# Patient Record
Sex: Female | Born: 1980 | Race: White | Hispanic: No | Marital: Single | Smoking: Never smoker
Health system: Southern US, Community
[De-identification: ages and names within clinical notes are randomized; demographics above are authoritative.]

## PROBLEM LIST (undated history)

## (undated) DIAGNOSIS — H4720 Unspecified optic atrophy: Secondary | ICD-10-CM

## (undated) HISTORY — DX: Unspecified optic atrophy: H47.20

---
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
## 2005-06-21 ENCOUNTER — Other Ambulatory Visit: Admission: RE | Admit: 2005-06-21 | Discharge: 2005-06-21 | Payer: Self-pay | Admitting: Family Medicine

## 2013-10-02 ENCOUNTER — Ambulatory Visit: Payer: BC Managed Care – PPO | Attending: Otolaryngology

## 2013-10-02 DIAGNOSIS — R490 Dysphonia: Secondary | ICD-10-CM | POA: Insufficient documentation

## 2013-10-07 ENCOUNTER — Ambulatory Visit: Payer: BC Managed Care – PPO

## 2013-10-15 ENCOUNTER — Ambulatory Visit: Payer: BC Managed Care – PPO | Attending: Otolaryngology

## 2013-10-15 DIAGNOSIS — R490 Dysphonia: Secondary | ICD-10-CM | POA: Insufficient documentation

## 2013-10-17 ENCOUNTER — Ambulatory Visit: Payer: BC Managed Care – PPO

## 2013-10-21 ENCOUNTER — Ambulatory Visit: Payer: BC Managed Care – PPO

## 2013-10-24 ENCOUNTER — Ambulatory Visit: Payer: BC Managed Care – PPO

## 2013-11-12 ENCOUNTER — Ambulatory Visit: Payer: BC Managed Care – PPO | Attending: Otolaryngology

## 2013-11-12 DIAGNOSIS — IMO0001 Reserved for inherently not codable concepts without codable children: Secondary | ICD-10-CM | POA: Diagnosis present

## 2013-11-12 DIAGNOSIS — R490 Dysphonia: Secondary | ICD-10-CM | POA: Insufficient documentation

## 2013-11-27 ENCOUNTER — Ambulatory Visit: Payer: BC Managed Care – PPO

## 2019-07-03 ENCOUNTER — Other Ambulatory Visit: Payer: Self-pay | Admitting: Cardiology

## 2019-07-03 DIAGNOSIS — Z20822 Contact with and (suspected) exposure to covid-19: Secondary | ICD-10-CM

## 2019-07-07 LAB — NOVEL CORONAVIRUS, NAA: SARS-CoV-2, NAA: NOT DETECTED

## 2019-10-11 ENCOUNTER — Ambulatory Visit: Payer: BC Managed Care – PPO | Attending: Internal Medicine

## 2019-10-11 DIAGNOSIS — Z23 Encounter for immunization: Secondary | ICD-10-CM | POA: Insufficient documentation

## 2019-10-11 NOTE — Progress Notes (Signed)
   Covid-19 Vaccination Clinic  Name:  Kelly Harris    MRN: [id number] DOB: 1980-10-08  10/11/2019  Kelly Harris was observed post Covid-19 immunization for 15 minutes without incidence. She was provided with Vaccine Information Sheet and instruction to access the V-Safe system.   Kelly Harris was instructed to call 911 with any severe reactions post vaccine: Marland Kitchen Difficulty breathing  . Swelling of your face and throat  . A fast heartbeat  . A bad rash all over your body  . Dizziness and weakness    Immunizations Administered    Name Date Dose VIS Date Route   Pfizer COVID-19 Vaccine 10/11/2019  4:39 PM 0.3 mL 07/25/2019 Intramuscular   Manufacturer: [employer], [employer]   Lot: [id number]   NDC: [address]-6

## 2019-11-01 ENCOUNTER — Ambulatory Visit: Payer: BC Managed Care – PPO | Attending: Internal Medicine

## 2019-11-01 DIAGNOSIS — Z23 Encounter for immunization: Secondary | ICD-10-CM

## 2019-11-01 NOTE — Progress Notes (Signed)
   Covid-19 Vaccination Clinic  Name:  Diva Lemberger    MRN: [id number] DOB: Jun 03, 1981  11/01/2019  Ms. Turnbo was observed post Covid-19 immunization for 15 minutes without incident. She was provided with Vaccine Information Sheet and instruction to access the V-Safe system.   Ms. Schou was instructed to call 911 with any severe reactions post vaccine: Marland Kitchen Difficulty breathing  . Swelling of face and throat  . A fast heartbeat  . A bad rash all over body  . Dizziness and weakness   Immunizations Administered    Name Date Dose VIS Date Route   Pfizer COVID-19 Vaccine 11/01/2019  8:46 AM 0.3 mL 07/25/2019 Intramuscular   Manufacturer: [employer], [employer]   Lot: [id number]   NDC: [address]-8

## 2020-11-10 ENCOUNTER — Emergency Department (HOSPITAL_BASED_OUTPATIENT_CLINIC_OR_DEPARTMENT_OTHER)
Admission: EM | Admit: 2020-11-10 | Discharge: 2020-11-10 | Disposition: A | Discharge status: Home / Self Care | Payer: BC Managed Care – PPO | Attending: Emergency Medicine | Admitting: Emergency Medicine

## 2020-11-10 ENCOUNTER — Other Ambulatory Visit: Payer: Self-pay

## 2020-11-10 ENCOUNTER — Encounter (HOSPITAL_BASED_OUTPATIENT_CLINIC_OR_DEPARTMENT_OTHER): Payer: Self-pay | Admitting: Emergency Medicine

## 2020-11-10 DIAGNOSIS — R002 Palpitations: Secondary | ICD-10-CM | POA: Diagnosis present

## 2020-11-10 LAB — COMPREHENSIVE METABOLIC PANEL
ALT: 14 U/L (ref 0–44)
AST: 14 U/L — ABNORMAL LOW (ref 15–41)
Albumin: 4.1 g/dL (ref 3.5–5.0)
Alkaline Phosphatase: 74 U/L (ref 38–126)
Anion gap: 10 (ref 5–15)
BUN: 8 mg/dL (ref 6–20)
CO2: 23 mmol/L (ref 22–32)
Calcium: 8.9 mg/dL (ref 8.9–10.3)
Chloride: 104 mmol/L (ref 98–111)
Creatinine, Ser: 0.76 mg/dL (ref 0.44–1.00)
GFR, Estimated: >60 mL/min (ref >60)
Glucose, Bld: 118 mg/dL — ABNORMAL HIGH (ref 70–99)
Potassium: 3.6 mmol/L (ref 3.5–5.1)
Sodium: 137 mmol/L (ref 135–145)
Total Bilirubin: 0.3 mg/dL (ref 0.3–1.2)
Total Protein: 7.3 g/dL (ref 6.5–8.1)

## 2020-11-10 LAB — CBC WITH DIFFERENTIAL/PLATELET
Abs Immature Granulocytes: 0.03 10*3/uL (ref 0.00–0.07)
Basophils Absolute: 0.1 10*3/uL (ref 0.0–0.1)
Basophils Relative: 1 %
Eosinophils Absolute: 0.1 10*3/uL (ref 0.0–0.5)
Eosinophils Relative: 1 %
HCT: 37.9 % (ref 36.0–46.0)
Hemoglobin: 12.4 g/dL (ref 12.0–15.0)
Immature Granulocytes: 0 %
Lymphocytes Relative: 18 %
Lymphs Abs: 1.4 10*3/uL (ref 0.7–4.0)
MCH: 29 pg (ref 26.0–34.0)
MCHC: 32.7 g/dL (ref 30.0–36.0)
MCV: 88.6 fL (ref 80.0–100.0)
Monocytes Absolute: 0.4 10*3/uL (ref 0.1–1.0)
Monocytes Relative: 5 %
Neutro Abs: 5.9 10*3/uL (ref 1.7–7.7)
Neutrophils Relative %: 75 %
Platelets: 297 10*3/uL (ref 150–400)
RBC: 4.28 MIL/uL (ref 3.87–5.11)
RDW: 13 % (ref 11.5–15.5)
WBC: 7.9 10*3/uL (ref 4.0–10.5)
nRBC: 0 % (ref 0.0–0.2)

## 2020-11-10 LAB — MAGNESIUM: Magnesium: 2 mg/dL (ref 1.7–2.4)

## 2020-11-10 LAB — TROPONIN I (HIGH SENSITIVITY): Troponin I (High Sensitivity): <2 ng/L (ref <18)

## 2020-11-10 LAB — LIPASE, BLOOD: Lipase: 5 U/L — ABNORMAL LOW (ref 11–51)

## 2020-11-10 LAB — D-DIMER, QUANTITATIVE: D-Dimer, Quant: 0.84 ug/mL-FEU — ABNORMAL HIGH (ref 0.00–0.50)

## 2020-11-10 MED ORDER — SODIUM CHLORIDE 0.9 % IV BOLUS
1000.0000 mL | Freq: Once | INTRAVENOUS | Status: AC
  Administered 2020-11-10: 1000 mL via INTRAVENOUS

## 2020-11-10 NOTE — ED Triage Notes (Signed)
Pt stated that 2 days ago she felt her heart was beating in a way that she was aware that it was beating, but not necessarily beating fast. Pt stated that today she felt her heart was racing. Pt also complains of a recent bout of diarrhea.

## 2020-11-10 NOTE — ED Provider Notes (Signed)
[facility] EMERGENCY DEPT Provider Note   CSN: [id number] Arrival date & time: 11/10/20  1435     History Chief Complaint  Patient presents with  . Chest Pain    Kelly Harris is a 40 y.o. female.  Patient presents with sensation that her heart is beating more firmly than it typically does.  She states this is been going off and on for the past 2 weeks.  She had it 2 weeks ago for a few days and then resolved.  She has been doing well until today she noticed her again when she felt her heartbeat was beating stronger than it typically does.  She denies any irregular beat sensation.  Denies any chest pain or chest pressure.  Denies any fever cough vomiting or diarrhea.        History reviewed. No pertinent past medical history.  There are no problems to display for this patient.   History reviewed. No pertinent surgical history.   OB History   No obstetric history on file.     No family history on file.  Social History   Substance Use Topics  . Alcohol use: Not Currently  . Drug use: Not Currently    Home Medications Prior to Admission medications   Not on File    Allergies    Amoxicillin  Review of Systems   Review of Systems  Constitutional: Negative for fever.  HENT: Negative for ear pain.   Eyes: Negative for pain.  Respiratory: Negative for cough.   Cardiovascular: Positive for palpitations. Negative for chest pain.  Gastrointestinal: Negative for abdominal pain.  Genitourinary: Negative for flank pain.  Musculoskeletal: Negative for back pain.  Skin: Negative for rash.  Neurological: Negative for headaches.    Physical Exam Updated Vital Signs BP (!) 154/83   Pulse 82   Temp 98.4 F (36.9 C) (Oral)   Resp 17   Ht 5\' 4"  (1.626 m)   Wt 108.9 kg   LMP 10/19/2020 (Approximate)   SpO2 100%   BMI 41.20 kg/m   Physical Exam Constitutional:      General: She is not in acute distress.    Appearance: Normal appearance.   HENT:     Head: Normocephalic.     Nose: Nose normal.  Eyes:     Extraocular Movements: Extraocular movements intact.  Cardiovascular:     Rate and Rhythm: Normal rate.  Pulmonary:     Effort: Pulmonary effort is normal.  Musculoskeletal:        General: Normal range of motion.     Cervical back: Normal range of motion.  Neurological:     General: No focal deficit present.     Mental Status: She is alert. Mental status is at baseline.     ED Results / Procedures / Treatments   Labs (all labs ordered are listed, but only abnormal results are displayed) Labs Reviewed  COMPREHENSIVE METABOLIC PANEL - Abnormal; Notable for the following components:      Result Value   Glucose, Bld 118 (*)    AST 14 (*)    All other components within normal limits  LIPASE, BLOOD - Abnormal; Notable for the following components:   Lipase 5 (*)    All other components within normal limits  D-DIMER, QUANTITATIVE - Abnormal; Notable for the following components:   D-Dimer, Quant 0.84 (*)    All other components within normal limits  CBC WITH DIFFERENTIAL/PLATELET  MAGNESIUM  TROPONIN I (HIGH SENSITIVITY)    EKG  EKG Interpretation  Date/Time:  Wednesday November 10 2020 14:43:49 EDT Ventricular Rate:  104 PR Interval:  189 QRS Duration: 93 QT Interval:  340 QTC Calculation: 448 R Axis:   102 Text Interpretation: Sinus tachycardia Right axis deviation Low voltage, precordial leads Borderline repolarization abnormality No old tracing to compare Confirmed by Melene Plan [phone]) on 11/10/2020 2:52:09 PM   Radiology No results found.  Procedures Procedures   Medications Ordered in ED Medications  sodium chloride 0.9 % bolus 1,000 mL (0 mLs Intravenous Stopped 11/10/20 1732)    ED Course  I have reviewed the triage vital signs and the nursing notes.  Pertinent labs & imaging results that were available during my care of the patient were reviewed by me and considered in my medical decision  making (see chart for details).    MDM Rules/Calculators/A&P                          Patient initially arrived hypertensive and tachycardic.  However she states that she feels a bit anxious given that she is coming in due to concern of her heart.  That she relax a bit in the ER heart rate and blood pressure appear improved.  Troponin is undetectable D-dimer elevated but in this setting significance is unclear.  I doubt pulmonary embolism given that she is asymptomatic at this time.  She has no leg swelling or peripheral edema, will recommend outpatient follow-up with her doctor within 3 to 4 days.  Recommend immediate return for worsening symptoms fevers pain trouble breathing or any additional concerns.  Final Clinical Impression(s) / ED Diagnoses Final diagnoses:  Palpitations    Rx / DC Orders ED Discharge Orders    None       Cheryll Cockayne, MD 11/10/20 1759

## 2020-11-10 NOTE — Discharge Instructions (Signed)
Call your primary care doctor or specialist as discussed in the next 2-3 days.   Return immediately back to the ER if:  Your symptoms worsen within the next 12-24 hours. You develop new symptoms such as new fevers, persistent vomiting, new pain, shortness of breath, or new weakness or numbness, or if you have any other concerns.  

## 2020-11-11 ENCOUNTER — Other Ambulatory Visit (HOSPITAL_BASED_OUTPATIENT_CLINIC_OR_DEPARTMENT_OTHER)
Admission: RE | Admit: 2020-11-11 | Discharge: 2020-11-11 | Disposition: A | Discharge status: Home / Self Care | Payer: BC Managed Care – PPO | Source: Ambulatory Visit | Attending: Nurse Practitioner | Admitting: Nurse Practitioner

## 2020-11-11 ENCOUNTER — Ambulatory Visit (HOSPITAL_BASED_OUTPATIENT_CLINIC_OR_DEPARTMENT_OTHER): Payer: BC Managed Care – PPO | Admitting: Family Medicine

## 2020-11-11 ENCOUNTER — Encounter (HOSPITAL_BASED_OUTPATIENT_CLINIC_OR_DEPARTMENT_OTHER): Payer: Self-pay | Admitting: Family Medicine

## 2020-11-11 VITALS — BP 156/100 | HR 96 | Ht 64.5 in | Wt 238.6 lb

## 2020-11-11 DIAGNOSIS — R030 Elevated blood-pressure reading, without diagnosis of hypertension: Secondary | ICD-10-CM | POA: Insufficient documentation

## 2020-11-11 DIAGNOSIS — R002 Palpitations: Secondary | ICD-10-CM | POA: Diagnosis present

## 2020-11-11 DIAGNOSIS — Z6841 Body Mass Index (BMI) 40.0 and over, adult: Secondary | ICD-10-CM | POA: Diagnosis present

## 2020-11-11 DIAGNOSIS — Z1283 Encounter for screening for malignant neoplasm of skin: Secondary | ICD-10-CM

## 2020-11-11 DIAGNOSIS — Z0000 Encounter for general adult medical examination without abnormal findings: Secondary | ICD-10-CM

## 2020-11-11 LAB — LIPID PANEL
Cholesterol: 181 mg/dL (ref 0–200)
HDL: 50 mg/dL (ref >40)
LDL Cholesterol: 111 mg/dL — ABNORMAL HIGH (ref 0–99)
Total CHOL/HDL Ratio: 3.6 RATIO
Triglycerides: 98 mg/dL (ref <150)
VLDL: 20 mg/dL (ref 0–40)

## 2020-11-11 LAB — HEMOGLOBIN A1C
Hgb A1c MFr Bld: 5.3 % (ref 4.8–5.6)
Mean Plasma Glucose: 105.41 mg/dL

## 2020-11-11 NOTE — Assessment & Plan Note (Signed)
Discussed lifestyle modifications as above Will monitor weight at follow-up office visits Will refer to nutritionist

## 2020-11-11 NOTE — Patient Instructions (Addendum)
  Medication Instructions:  Your physician recommends that you continue on your current medications as directed. Please refer to the Current Medication list given to you today. --If you need a refill on any your medications before your next appointment, please call your pharmacy first. If no refills are authorized on file call the office.--  Lab Work: Your physician has recommended that you have lab work today: Hemoglobin A1C, Lipid Panel, and Thyroid Panel If you have labs (blood work) drawn today and your tests are completely normal, you will receive your results only by: Marland Kitchen MyChart Message (if you have MyChart) OR . A phone call from our staff. Please ensure you check your voicemail in the event that you authorized detailed messages to be left on a delegated number. If you have any lab test that is abnormal or we need to change your treatment, we will call you to review the results.  Referrals/Procedures/Imaging: A referral has been placed for you to Dr. Hyacinth Meeker for OB/GYBN. Someone from the scheduling department will be in contact with you in regards to coordinating your consultation. If you do not hear from any of the schedulers within 7-10 business days please give our office a call.  A referral has been placed for you to Dermatology for skin cancer screening. Someone from the scheduling department will be in contact with you in regards to coordinating your consultation. If you do not hear from any of the schedulers within 7-10 business days please give our office a call.  A referral has been placed for you to Medical Nutrition Therapy. Someone from the scheduling department will be in contact with you in regards to coordinating your consultation. If you do not hear from any of the schedulers within 7-10 business days please give our office a call.  Follow-Up: Your next appointment:   Your physician recommends that you schedule a follow-up appointment in: 1 MONTH with Dr. De Peru   Thanks  for letting us be apart of your health journey!!  Primary Care and Sports Medicine   Dr. de Peru and Shawna Clamp, DNP, AGNP  We recommend signing up for the patient portal called "MyChart".  Sign up information is provided on this After Visit Summary.  MyChart is used to connect with patients for Virtual Visits (Telemedicine).  Patients are able to view lab/test results, encounter notes, upcoming appointments, etc.  Non-urgent messages can be sent to your provider as well.   To learn more about what you can do with MyChart, go to [email or web address].     ---- CHECL YOUR BLOOD PRESSURE TWICE DAILY UNTIL FOLLOW UP APPOINTMENT AND RECORD ON LOG. BRING YOUR BLOOD PRESSURE CUFF IN WITH YOU TO FOLLOW UP APPOINTMENT------

## 2020-11-11 NOTE — Assessment & Plan Note (Addendum)
Reviewed documentation, labs, EKG from ER visit yesterday Blood pressure elevated at emergency room visit yesterday as well as in the office today She does have blood pressure cuff at home, advised on monitoring at home and keeping blood pressure log to bring to next office visit Advised on lifestyle modifications including low-salt diet, recommended weekly activity level, encouraging healthy weight loss Plan for follow-up in about 4 weeks to monitor

## 2020-11-11 NOTE — Progress Notes (Signed)
New Patient Office Visit  Subjective:  Patient ID: Kelly Harris, female    DOB: [birth year]  Age: 40 y.o. MRN: [id number]  CC:  Chief Complaint  Patient presents with  . Establish Care  . Palpitations  . Diarrhea    HPI Kelly Harris is a 40 year old female presenting to establish in clinic.  She has current concerns related to palpitations, elevated blood pressure.  Denies any significant past medical history  Palpitations: Presented to ED yesterday for evaluation of palpitations.  While at the ED she was found to have elevated blood pressure.  Lab work-up and EKG were largely unremarkable.  She reports an associated diarrhea that developed around the same time as palpitations about 2 weeks ago.  She did have a period of a couple days where she did not have any bowel movements and thus used Colace with subsequent frequent, loose stools again.  She is not aware of being told about high blood pressure in the past.  Has been at least 3 years since having a PCP.  Denies any chest pain or vision changes, no increased shortness of breath.  Palpitations are somewhat lessened today compared to yesterday.  Patient also desiring referrals to OB/GYN for cervical cancer screening and dermatology for skin cancer screening.  History reviewed. No pertinent past medical history.  History reviewed. No pertinent surgical history.  Family History  Problem Relation Age of Onset  . Stroke Mother   . Hypertension Mother   . Diabetes Mother   . Congestive Heart Failure Father   . Heart disease Father   . Hypertension Father   . COPD Paternal Grandmother     Social History   Socioeconomic History  . Marital status: Single    Spouse name: Not on file  . Number of children: Not on file  . Years of education: Not on file  . Highest education level: Not on file  Occupational History  . Not on file  Tobacco Use  . Smoking status: Never Smoker  . Smokeless tobacco: Never Used  Vaping Use   . Vaping Use: Never used  Substance and Sexual Activity  . Alcohol use: Not Currently  . Drug use: Not Currently  . Sexual activity: Not Currently    Birth control/protection: None  Other Topics Concern  . Not on file  Social History Narrative  . Not on file   Social Determinants of Health   Financial Resource Strain: Not on file  Food Insecurity: Not on file  Transportation Needs: Not on file  Physical Activity: Not on file  Stress: Not on file  Social Connections: Not on file  Intimate Partner Violence: Not on file    Objective:   Today's Vitals: BP (!) 156/100   Pulse 96   Ht 5' 4.5" (1.638 m)   Wt 238 lb 9.6 oz (108.2 kg)   LMP 10/19/2020 (Approximate)   SpO2 100%   BMI 40.32 kg/m   Physical Exam  Pleasant 40 year old female in no acute distress Cardiovascular exam with regular rate and rhythm, no murmurs appreciated Lungs clear to auscultation bilaterally  Assessment & Plan:   Problem List Items Addressed This Visit      Other   Elevated blood pressure reading in office without diagnosis of hypertension    Blood pressure elevated at emergency room visit yesterday as well as in the office today She does have blood pressure cuff at home, advised on monitoring at home and keeping blood pressure log to bring to next office  visit Advised on lifestyle modifications including low-salt diet, recommended weekly activity level, encouraging healthy weight loss Plan for follow-up in about 4 weeks to monitor      Relevant Orders   Amb ref to Medical Nutrition Therapy-[facility]   Thyroid Panel With TSH   Hemoglobin A1c   Lipid panel   BMI 40.0-44.9, adult (HCC)    Discussed lifestyle modifications as above Will monitor weight at follow-up office visits Will refer to nutritionist      Relevant Orders   Amb ref to Medical Nutrition Therapy-[facility]   Thyroid Panel With TSH   Hemoglobin A1c   Lipid panel    Other Visit Diagnoses    Encounter for medical examination to  establish care    -  Primary   Relevant Orders   Ambulatory referral to Obstetrics / Gynecology   Screening for skin cancer       Relevant Orders   Ambulatory referral to Dermatology   Palpitation       Relevant Orders   Thyroid Panel With TSH      No outpatient encounter medications on file as of 11/11/2020.   No facility-administered encounter medications on file as of 11/11/2020.   Spent 60 minutes on this patient encounter, including preparation, chart review, face-to-face counseling with patient and coordination of care, and documentation of encounter  Follow-up: Return in about 1 month (around 12/11/2020) for Follow Up.   Starling Christofferson J De Peru, MD

## 2020-11-12 LAB — THYROID PANEL WITH TSH
Free Thyroxine Index: 1.9 (ref 1.2–4.9)
T3 Uptake Ratio: 27 % (ref 24–39)
T4, Total: 6.9 ug/dL (ref 4.5–12.0)
TSH: 1.6 u[IU]/mL (ref 0.450–4.500)

## 2020-11-14 ENCOUNTER — Telehealth (HOSPITAL_BASED_OUTPATIENT_CLINIC_OR_DEPARTMENT_OTHER): Payer: Self-pay

## 2020-11-14 NOTE — Telephone Encounter (Signed)
Results released by Dr. De [place] and reviewed by patient via MyChart Instructed patient to contact the office with any questions or concerns. 

## 2020-11-14 NOTE — Telephone Encounter (Signed)
-----   Message from Hosie Poisson Peru, MD sent at 11/12/2020  8:47 AM EDT ----- Overall labs look good.  Thyroid function normal.  A1c test checking for diabetes was normal.  Cholesterol overall looks good with slight elevation in the "bad" cholesterol.

## 2020-12-13 ENCOUNTER — Other Ambulatory Visit (HOSPITAL_BASED_OUTPATIENT_CLINIC_OR_DEPARTMENT_OTHER): Payer: Self-pay

## 2020-12-13 ENCOUNTER — Ambulatory Visit (HOSPITAL_BASED_OUTPATIENT_CLINIC_OR_DEPARTMENT_OTHER): Payer: BC Managed Care – PPO | Admitting: Family Medicine

## 2020-12-13 ENCOUNTER — Other Ambulatory Visit: Payer: Self-pay

## 2020-12-13 ENCOUNTER — Encounter (HOSPITAL_BASED_OUTPATIENT_CLINIC_OR_DEPARTMENT_OTHER): Payer: Self-pay | Admitting: Family Medicine

## 2020-12-13 VITALS — BP 150/94 | HR 102 | Ht 65.4 in | Wt 225.4 lb

## 2020-12-13 DIAGNOSIS — R030 Elevated blood-pressure reading, without diagnosis of hypertension: Secondary | ICD-10-CM

## 2020-12-13 NOTE — Progress Notes (Signed)
    Procedures performed today:    None.  Independent interpretation of notes and tests performed by another provider:   None.  Brief History, Exam, Impression, and Recommendations:    Isis is a 41 year old female presenting for follow-up of palpitations and elevated blood pressure readings.  Today she brought blood pressure log with her.  This shows that blood pressure has been at goal in recent weeks.  There is a trend of gradually decreasing systolic and diastolic blood pressure since last visit with me.  She indicates that she has been making lifestyle modifications including dietary changes and gradual increase in physical activity.  Reports that she is trying to incorporate more fresh fruits and vegetables and decreasing salt intake.  Reports about an intentional 10 pound weight loss since last visit.  Denies any issues with lightheadedness, dizziness, chest pain, shortness of breath.  She also denies any episodes of palpitations since last visit with me.  Overall feels that she has been doing well.  BP (!) 150/94 (BP Location: Left Arm)   Pulse (!) 102   Ht 5' 5.4" (1.661 m)   Wt 225 lb 6.4 oz (102.2 kg)   SpO2 96%   BMI 37.05 kg/m   Pleasant 39 year old female in no acute distress Cardiovascular exam with regular rate and rhythm, no murmurs appreciated  Elevated blood pressure reading in office without diagnosis of hypertension Blood pressure elevated in the office today, however review of home blood pressure log with normal readings Home BP log scanned into chart Continue with lifestyle modifications, provided encouragement regarding healthy weight loss Has upcoming appointment with nutritionist, encouraged to keep this Plan for follow-up in about 2 months Will have patient bring home blood pressure cuff to compare to our cuff in office    ___________________________________________ Madoc Holquin de Peru, MD, ABFM, CAQSM Primary Care and Sports Medicine [facility]

## 2020-12-13 NOTE — Patient Instructions (Signed)
  Medication Instructions:  Your physician recommends that you continue on your current medications as directed. Please refer to the Current Medication list given to you today. --If you need a refill on any your medications before your next appointment, please call your pharmacy first. If no refills are authorized on file call the office.--  Follow-Up: Your next appointment:   Your physician recommends that you schedule a follow-up appointment in: 2 MONTHS with Dr. de Peru  Thanks for letting us be apart of your health journey!!  Primary Care and Sports Medicine   Dr. de Peru and Shawna Clamp, DNP, AGNP  We recommend signing up for the patient portal called "MyChart".  Sign up information is provided on this After Visit Summary.  MyChart is used to connect with patients for Virtual Visits (Telemedicine).  Patients are able to view lab/test results, encounter notes, upcoming appointments, etc.  Non-urgent messages can be sent to your provider as well.   To learn more about what you can do with MyChart, please visit --  [email or web address].

## 2020-12-14 NOTE — Assessment & Plan Note (Signed)
Blood pressure elevated in the office today, however review of home blood pressure log with normal readings Home BP log scanned into chart Continue with lifestyle modifications, provided encouragement regarding healthy weight loss Has upcoming appointment with nutritionist, encouraged to keep this Plan for follow-up in about 2 months Will have patient bring home blood pressure cuff to compare to our cuff in office

## 2020-12-22 ENCOUNTER — Encounter: Payer: BC Managed Care – PPO | Attending: Family Medicine | Admitting: Dietician

## 2020-12-22 ENCOUNTER — Encounter: Payer: Self-pay | Admitting: Dietician

## 2020-12-22 ENCOUNTER — Other Ambulatory Visit: Payer: Self-pay

## 2020-12-22 DIAGNOSIS — R030 Elevated blood-pressure reading, without diagnosis of hypertension: Secondary | ICD-10-CM | POA: Diagnosis present

## 2020-12-22 DIAGNOSIS — Z6841 Body Mass Index (BMI) 40.0 and over, adult: Secondary | ICD-10-CM | POA: Diagnosis present

## 2020-12-22 NOTE — Progress Notes (Signed)
Medical Nutrition Therapy  Appointment Start time:  1600  Appointment End time:  1700  Primary concerns today: Blood pressure, weight loss  Referral diagnosis: R03.0 Elevated blood pressure reading in office without diagnosis of hypertension, Z68.41 BMI 40.0 -44.9, Adult Preferred learning style: No preference indicated Learning readiness: Change in progress   NUTRITION ASSESSMENT   Anthropometrics  Ht:5'5" Wt: 224.1 lbs Body mass index is 37.29 kg/m.   Clinical Medical Hx: Heart palpitations, Vitamin D deficiency Medications: N/A Labs: LDL -111 (high), Lipase - 5 (low) Notable Signs/Symptoms: N/A  Lifestyle & Dietary Hx Pt reports a bout of GI discomfort and overall discomfort that led to them visiting the doctor and getting a diagnosis of elevated blood pressure. Pt has been monitoring blood pressure at home for the past 5-6 weeks and has been keeping a log. Average BP started at around ~140/90, is now ~120/80.  Pt reports making lifestyle and dietary changes over the last 6 weeks. Pt has been walking daily with their dog for 45 minutes. Also adding in body weight resistance exercises. Pt feels these changes are sustainable. Pt reports losing close to 20 pounds over the last 6 weeks. Pt is using My[employer] to track foods. Pt brought printed food log for the past month. Pt diet consists of a large variety of fruits and vegetables, plant based and lean proteins. Pt has reduced consumption of processed foods to reduce sodium intake. Pt is using salt free seasonings   Estimated daily fluid intake: 64 oz Supplements: N/A Sleep: Sleeps well Stress / self-care: Moderate, not too bad Current average weekly physical activity: Walks with dog daily ~45 minutes  24-Hr Dietary Recall First Meal: Avocado toast, boiled egg, OJ Snack: banana Second Meal: Salad with cheese, [place], balsamic vinegarette, hummus, wheat thins Snack: none Third Meal: Garden veggie pizza, ginger ale Snack:  none Beverages: Water, ginger ale   NUTRITION DIAGNOSIS  NB-1.1 Food and nutrition-related knowledge deficit As related to elevated blood pressure.  As evidenced by blood pressure reading of 156/100, previous sedentary lifestly, and intake high in sodium,.   NUTRITION INTERVENTION  Nutrition education (E-1) on the following topics:  . Educated patient on an appropriate blood pressure goal of <120/80. Educated patient on the relationship between dietary sodium intake and hypertension. Recommended between 1,500 and 2,000 mg of sodium per day. Educated patient on common sources of high sodium foods including packaged/processed foods, deli meats, fast foods, pickled food, sports drinks, and canned foods. Educated patient on the potential for kidney and heart complications related to uncontrolled hypertension. Educated patient on the combined effect of hypertension and elevated cholesterol on cardiovascular health. Educated patient on the positive impact of physical activity in lowering blood pressure and improving cardiovascular health.    Handouts Provided Include   White Bean Bruschetta Recipe  Hypertension Nutrition Therapy Nutrition Care Manual  Learning Style & Readiness for Change Teaching method utilized: Visual & Auditory  Demonstrated degree of understanding via: Teach Back  Barriers to learning/adherence to lifestyle change: None  Goals Established by Pt  Look into Roots brand hummus for a lower sodium hummus option  Try Chicken of the [employer] for a lower sodium salmon option.  Look into Spice [place] seasonings for great low or sodium free seasonings  Take a One-A-Day Prenatal vitamin.  Increase your calcium with 1% milk, fat-free greek yogurt, and calcium fortified orange juice  Look into https://www.dunlap.com/ for heart healthy recipes    MONITORING & EVALUATION Dietary intake, weekly physical activity,  and sodium intake in 1 month.  Next Steps  Patient is to  follow up with Rd.

## 2020-12-22 NOTE — Patient Instructions (Addendum)
Look into Roots brand hummus for a lower sodium hummus option  Try Chicken of the [employer] for a lower sodium salmon option.  Look into Spice [place] seasonings for great low or sodium free seasonings  Take a One-A-Day Prenatal vitamin.  Increase your calcium with 1% milk, fat-free greek yogurt, and calcium fortified orange juice  Look into [email or web address].dunlap.[email or web address] for heart healthy recipes

## 2021-01-27 ENCOUNTER — Ambulatory Visit (HOSPITAL_BASED_OUTPATIENT_CLINIC_OR_DEPARTMENT_OTHER): Payer: BC Managed Care – PPO | Admitting: Obstetrics & Gynecology

## 2021-02-03 ENCOUNTER — Encounter: Payer: BC Managed Care – PPO | Attending: Family Medicine | Admitting: Dietician

## 2021-02-03 ENCOUNTER — Encounter: Payer: Self-pay | Admitting: Dietician

## 2021-02-03 ENCOUNTER — Other Ambulatory Visit: Payer: Self-pay

## 2021-02-03 DIAGNOSIS — R030 Elevated blood-pressure reading, without diagnosis of hypertension: Secondary | ICD-10-CM | POA: Insufficient documentation

## 2021-02-03 NOTE — Progress Notes (Signed)
Medical Nutrition Therapy  Appointment Start time:  1400  Appointment End time:  1430  Primary concerns today: Blood pressure, weight loss  Referral diagnosis: R03.0 Elevated blood pressure reading in office without diagnosis of hypertension, Z68.41 BMI 40.0 -44.9, Adult Preferred learning style: No preference indicated Learning readiness: Change in progress   NUTRITION ASSESSMENT   Anthropometrics  Ht:5'5" Wt: 214.6 lbs Wt Change: - 10 lbs Body mass index is 35.71 kg/m.   Clinical Medical Hx: Heart palpitations, Vitamin D deficiency Medications: N/A Labs: LDL -111 (high), Lipase - 5 (low) Notable Signs/Symptoms: N/A  Lifestyle & Dietary Hx Pt has continued to log foods. Pt food log reveals they are hitting their daily goals of 1,600 kcal and 2,300 mg sodium very often. Pt is eating fat-free greek yogurt and fortified orange juice to increase calcium. Also incorporating ground [place] and smoked salmon for lean proteins as well.  Pt still checking BP at home, brought log in. Average BP in left arm is~120/85, right arm is ~ 135/90 Walking daily, getting around 10,000 steps a day.Pt is resistance training twice a week, 3 days apart.  Pt is using Fit[employer] app for exercises. Does full body workouts. Pt states their motivation initially came from health concerns, but their successful weight loss has become a powerful motivator to continue these changes.     Estimated daily fluid intake: 64 oz Supplements: NEW: Women's MV Sleep: Sleeps well Stress / self-care: Moderate, not too bad Current average weekly physical activity: Walks with dog daily ~45 minutes, 2x days resistance training   24-Hr Dietary Recall First Meal: Egg white frittata, [place] sausage Snack: none Second Meal: Acai bowl w/ granola, spinach, kiwi, raspberries Snack: none Third Meal: Leftover mediterranean food, lamb, cous cous, artichoke Snack: none Beverages: water, OJ    NUTRITION DIAGNOSIS  NB-1.1 Food  and nutrition-related knowledge deficit As related to elevated blood pressure.  As evidenced by blood pressure reading of 156/100, previous sedentary lifestly, and intake high in sodium,.   NUTRITION INTERVENTION  Nutrition education (E-1) on the following topics:  Educated patient on an appropriate blood pressure goal of <120/80. Educated patient on the relationship between dietary sodium intake and hypertension. Recommended between 1,500 and 2,000 mg of sodium per day. Educated patient on common sources of high sodium foods including packaged/processed foods, deli meats, fast foods, pickled food, sports drinks, and canned foods. Educated patient on the potential for kidney and heart complications related to uncontrolled hypertension. Educated patient on the combined effect of hypertension and elevated cholesterol on cardiovascular health. Educated patient on the positive impact of physical activity in lowering blood pressure and improving cardiovascular health.  NEW: Educate pt on the difference between LDL and HDL cholesterol. Educate pt on the factors that can increase and decrease HDL cholesterol. Including exercise to increase HDL, and tobacco use to decrease HDL. Educate pt on factors that can elevate LDL cholesterol, including high dietary intake of saturated fats. Educate pt on identifying sources of saturated fats, and how to make alternative food choices to lower saturated fat intake. Educate pt on the role of soluble fiber in binding to cholesterol in the GI tract an eliminating it from the body. Educate pt on dietary sources of soluble fiber. Educate pt on the potential dietary causes of hypertriglyceridemia.Educate on the role of elevated LDL,total cholesterol, and triglycerides on cardiovascular health. Educate pt on the role of physical activity in lowering LDL and increasing HDL cholesterol.    Handouts Provided Include  White Bean Bruschetta  Recipe Hypertension Nutrition Therapy  Nutrition Care Manual NEW: Soluble Fiber List Nutrition Care Manual  Learning Style & Readiness for Change Teaching method utilized: Visual & Auditory  Demonstrated degree of understanding via: Teach Back  Barriers to learning/adherence to lifestyle change: None  Goals Established by Pt Add in a third day of resistance training. Try not to allow more than 48 hours between exercise sessions to maximize cardiovascular benefits. Add in a calcium chewable in between breakfast and lunch, or between lunch and dinner. Continue to limit saturated fats (high fat meats, dairy, processed and packaged foods) and choose unsaturated fats (plant based oils, seafood, nuts, seeds, avocado). Increase soluble fiber intake to 5-10 grams a day. Use your soluble fiber handout for a variety of options.   MONITORING & EVALUATION Dietary intake, weekly physical activity, sodium and calcium intake in 2 months.  Next Steps  Patient is to follow up with RDN.

## 2021-02-03 NOTE — Patient Instructions (Addendum)
Add in a third day of resistance training. Try not to allow more than 48 hours between exercise sessions to maximize cardiovascular benefits.  Add in a calcium chewable in between breakfast and lunch, or between lunch and dinner.  Continue to limit saturated fats (high fat meats, dairy, processed and packaged foods) and choose unsaturated fats (plant based oils, seafood, nuts, seeds, avocado).  Increase soluble fiber intake to 5-10 grams a day. Use your soluble fiber handout for a variety of options.

## 2021-02-15 ENCOUNTER — Other Ambulatory Visit: Payer: Self-pay

## 2021-02-15 ENCOUNTER — Encounter (HOSPITAL_BASED_OUTPATIENT_CLINIC_OR_DEPARTMENT_OTHER): Payer: Self-pay | Admitting: Family Medicine

## 2021-02-15 ENCOUNTER — Ambulatory Visit (HOSPITAL_BASED_OUTPATIENT_CLINIC_OR_DEPARTMENT_OTHER): Payer: BC Managed Care – PPO | Admitting: Family Medicine

## 2021-02-15 VITALS — BP 146/96 | HR 97 | Ht 64.5 in | Wt 210.0 lb

## 2021-02-15 DIAGNOSIS — E669 Obesity, unspecified: Secondary | ICD-10-CM | POA: Diagnosis not present

## 2021-02-15 DIAGNOSIS — R030 Elevated blood-pressure reading, without diagnosis of hypertension: Secondary | ICD-10-CM

## 2021-02-15 NOTE — Assessment & Plan Note (Signed)
Has been checking blood pressure at home, log brought to office today which has been scanned into the chart Continues with lifestyle modifications including dietary changes as well as increased level of physical activity Has met with dietitian twice since last visit with me, this has been going well No specific concerns today Cardiovascular exam with regular rate and rhythm, no murmurs appreciated Blood pressure slightly elevated in the office today, however given home blood pressure readings, will continue to monitor and allow for continuing with lifestyle modifications Discussed recommendations for aerobic and anaerobic exercise weekly Continue follow-up with dietitian Plan for follow-up with me in about 3 to 4 months to monitor above

## 2021-02-15 NOTE — Patient Instructions (Addendum)
  Medication Instructions:  Your physician recommends that you continue on your current medications as directed. Please refer to the Current Medication list given to you today. --If you need a refill on any your medications before your next appointment, please call your pharmacy first. If no refills are authorized on file call the office.-- Follow-Up: Your next appointment:   Your physician recommends that you schedule a follow-up appointment in: 3-4 MONTHS with Dr. de Cuba  Thanks for letting us be apart of your health journey!!  Primary Care and Sports Medicine   Dr. Raymond de Cuba   We encourage you to activate your patient portal called "MyChart".  Sign up information is provided on this After Visit Summary.  MyChart is used to connect with patients for Virtual Visits (Telemedicine).  Patients are able to view lab/test results, encounter notes, upcoming appointments, etc.  Non-urgent messages can be sent to your provider as well. To learn more about what you can do with MyChart, please visit --  [email or web address].    

## 2021-02-15 NOTE — Assessment & Plan Note (Addendum)
Continues to have gradual decrease in BMI with gradual, healthy weight loss Reviewed office notes from appointments with dietitian Encouraged to continue with positive lifestyle modifications that patient has made

## 2021-02-15 NOTE — Progress Notes (Signed)
    Procedures performed today:    None.  Independent interpretation of notes and tests performed by another provider:   None.  Brief History, Exam, Impression, and Recommendations:    BP (!) 146/96   Pulse 97   Ht 5' 4.5" (1.638 m)   Wt 210 lb (95.3 kg)   SpO2 98%   BMI 35.49 kg/m   Elevated blood pressure reading in office without diagnosis of hypertension Has been checking blood pressure at home, log brought to office today which has been scanned into the chart Continues with lifestyle modifications including dietary changes as well as increased level of physical activity Has met with dietitian twice since last visit with me, this has been going well No specific concerns today Cardiovascular exam with regular rate and rhythm, no murmurs appreciated Blood pressure slightly elevated in the office today, however given home blood pressure readings, will continue to monitor and allow for continuing with lifestyle modifications Discussed recommendations for aerobic and anaerobic exercise weekly Continue follow-up with dietitian Plan for follow-up with me in about 3 to 4 months to monitor above  Obesity, Class II, BMI 35-39.9 Continues to have gradual decrease in BMI with gradual, healthy weight loss Encouraged to continue with positive lifestyle modifications that patient has made  Spent 30 minutes on this patient encounter, including preparation, chart review, face-to-face counseling with patient and coordination of care, and documentation of encounter   ___________________________________________ Raymond de [place], MD, ABFM, CAQSM Primary Care and Sports Medicine [facility]   

## 2021-03-03 ENCOUNTER — Ambulatory Visit (HOSPITAL_BASED_OUTPATIENT_CLINIC_OR_DEPARTMENT_OTHER): Payer: BC Managed Care – PPO | Admitting: Family Medicine

## 2021-03-03 ENCOUNTER — Other Ambulatory Visit: Payer: Self-pay

## 2021-03-03 ENCOUNTER — Encounter (HOSPITAL_BASED_OUTPATIENT_CLINIC_OR_DEPARTMENT_OTHER): Payer: Self-pay | Admitting: Family Medicine

## 2021-03-03 DIAGNOSIS — H6981 Other specified disorders of Eustachian tube, right ear: Secondary | ICD-10-CM | POA: Insufficient documentation

## 2021-03-03 NOTE — Progress Notes (Signed)
    Procedures performed today:    None.  Independent interpretation of notes and tests performed by another provider:   None.  Brief History, Exam, Impression, and Recommendations:    BP (!) 138/94   Pulse (!) 111   Ht 5' 4.5" (1.638 m)   Wt 206 lb 12.8 oz (93.8 kg)   SpO2 97%   BMI 34.95 kg/m   Eustachian tube dysfunction, right Patient notes that she recently had some changes in hearing and felt that she had wax buildup in her ear.  She used OTC measures to remove some of this wax including solution used to clean out the ear.  She did remove some wax but has some concerns about any residual wax remaining as she has been noticing some pressure and "whooshing" noises.  She does report that she has had some sinus congestion, has used Nettie pot to help with this. On exam, auditory canals are clear bilaterally with normal-appearing tympanic membranes.  No evidence of significant cerumen bilaterally. Suspect that patient may have some underlying eustachian tube dysfunction on the right side that could be leading to some of her symptoms Recommend focusing treatment on resolution of sinus congestion.  Recommend use of oral antihistamine, intranasal steroid, nasal saline spray If symptoms not improving with above, consider referral to ENT Plan for follow-up for previously scheduled appointment in about 2 months   ___________________________________________ Syretta Kochel de [place], MD, ABFM, CAQSM Primary Care and Sports Medicine [facility]

## 2021-03-03 NOTE — Assessment & Plan Note (Signed)
Patient notes that she recently had some changes in hearing and felt that she had wax buildup in her ear.  She used OTC measures to remove some of this wax including solution used to clean out the ear.  She did remove some wax but has some concerns about any residual wax remaining as she has been noticing some pressure and "whooshing" noises.  She does report that she has had some sinus congestion, has used Nettie pot to help with this. On exam, auditory canals are clear bilaterally with normal-appearing tympanic membranes.  No evidence of significant cerumen bilaterally. Suspect that patient may have some underlying eustachian tube dysfunction on the right side that could be leading to some of her symptoms Recommend focusing treatment on resolution of sinus congestion.  Recommend use of oral antihistamine, intranasal steroid, nasal saline spray If symptoms not improving with above, consider referral to ENT Plan for follow-up for previously scheduled appointment in about 2 months

## 2021-03-03 NOTE — Patient Instructions (Signed)
  Medication Instructions:  Your physician recommends that you continue on your current medications as directed. Please refer to the Current Medication list given to you today. --If you need a refill on any your medications before your next appointment, please call your pharmacy first. If no refills are authorized on file call the office.-- Follow-Up: Your next appointment:   Your physician recommends that you schedule a follow-up appointment -- Keep Scheduled appointment  Thanks for letting us be apart of your health journey!!  Primary Care and Sports Medicine   Dr. Ceasar Mons [place]   We encourage you to activate your patient portal called "MyChart".  Sign up information is provided on this After Visit Summary.  MyChart is used to connect with patients for Virtual Visits (Telemedicine).  Patients are able to view lab/test results, encounter notes, upcoming appointments, etc.  Non-urgent messages can be sent to your provider as well. To learn more about what you can do with MyChart, please visit --  [email or web address].

## 2021-04-12 ENCOUNTER — Ambulatory Visit (HOSPITAL_BASED_OUTPATIENT_CLINIC_OR_DEPARTMENT_OTHER): Payer: BC Managed Care – PPO | Admitting: Obstetrics & Gynecology

## 2021-04-21 ENCOUNTER — Other Ambulatory Visit: Payer: Self-pay

## 2021-04-21 ENCOUNTER — Encounter (HOSPITAL_BASED_OUTPATIENT_CLINIC_OR_DEPARTMENT_OTHER): Payer: Self-pay | Admitting: Obstetrics & Gynecology

## 2021-04-21 ENCOUNTER — Encounter: Payer: BC Managed Care – PPO | Attending: Family Medicine | Admitting: Dietician

## 2021-04-21 ENCOUNTER — Ambulatory Visit: Payer: BC Managed Care – PPO | Admitting: Physician Assistant

## 2021-04-21 ENCOUNTER — Encounter: Payer: Self-pay | Admitting: Physician Assistant

## 2021-04-21 ENCOUNTER — Ambulatory Visit (HOSPITAL_BASED_OUTPATIENT_CLINIC_OR_DEPARTMENT_OTHER)
Admission: RE | Admit: 2021-04-21 | Discharge: 2021-04-21 | Disposition: A | Discharge status: Home / Self Care | Payer: BC Managed Care – PPO | Source: Ambulatory Visit | Attending: Obstetrics & Gynecology | Admitting: Obstetrics & Gynecology

## 2021-04-21 ENCOUNTER — Other Ambulatory Visit (HOSPITAL_COMMUNITY)
Admission: RE | Admit: 2021-04-21 | Discharge: 2021-04-21 | Disposition: A | Discharge status: Home / Self Care | Payer: BC Managed Care – PPO | Source: Ambulatory Visit | Attending: Obstetrics & Gynecology | Admitting: Obstetrics & Gynecology

## 2021-04-21 ENCOUNTER — Ambulatory Visit (HOSPITAL_BASED_OUTPATIENT_CLINIC_OR_DEPARTMENT_OTHER): Payer: BC Managed Care – PPO | Admitting: Obstetrics & Gynecology

## 2021-04-21 ENCOUNTER — Encounter: Payer: Self-pay | Admitting: Dietician

## 2021-04-21 VITALS — Ht 65.0 in | Wt 196.5 lb

## 2021-04-21 VITALS — BP 144/75 | HR 79 | Ht 64.5 in | Wt 196.8 lb

## 2021-04-21 DIAGNOSIS — Z6832 Body mass index (BMI) 32.0-32.9, adult: Secondary | ICD-10-CM | POA: Diagnosis present

## 2021-04-21 DIAGNOSIS — L918 Other hypertrophic disorders of the skin: Secondary | ICD-10-CM | POA: Diagnosis not present

## 2021-04-21 DIAGNOSIS — Z1283 Encounter for screening for malignant neoplasm of skin: Secondary | ICD-10-CM | POA: Diagnosis not present

## 2021-04-21 DIAGNOSIS — Z1231 Encounter for screening mammogram for malignant neoplasm of breast: Secondary | ICD-10-CM | POA: Insufficient documentation

## 2021-04-21 DIAGNOSIS — Z01419 Encounter for gynecological examination (general) (routine) without abnormal findings: Secondary | ICD-10-CM | POA: Diagnosis not present

## 2021-04-21 DIAGNOSIS — E669 Obesity, unspecified: Secondary | ICD-10-CM | POA: Diagnosis not present

## 2021-04-21 DIAGNOSIS — R030 Elevated blood-pressure reading, without diagnosis of hypertension: Secondary | ICD-10-CM | POA: Diagnosis not present

## 2021-04-21 DIAGNOSIS — Z124 Encounter for screening for malignant neoplasm of cervix: Secondary | ICD-10-CM | POA: Insufficient documentation

## 2021-04-21 DIAGNOSIS — R009 Unspecified abnormalities of heart beat: Secondary | ICD-10-CM

## 2021-04-21 NOTE — Patient Instructions (Addendum)
Keep your calories between 1600-1800 per day to continue your rate of healthy weigh loss. Consume between 90-100 grams of protein.  Try the Bird's Eye "Steam in Bag" brussell sprouts.  Have a high carb, moderate protein snack before exercising, and a fast digesting protein right after exercising.  Use your handouts for great ideas for pre- and post- workouts snacks/meals.

## 2021-04-21 NOTE — Progress Notes (Signed)
40 y.o. G0 Single White or Caucasian female here for annual exam.  She needs to establish gyn care.  Cycles are regular.  Flow is moderate and lasts about 3-4 days with some spotting for another two days.  Cramping is mild.  Sexually active: No.  The current method of family planning is abstinence.    Exercising: Yes.     Walks her dog every day Smoker:  no  Health Maintenance: Pap:  about 4 years History of abnormal Pap:  yes MMG:  has not started Colonoscopy:  guidelines reviewed TDaP:  03/20/2018 Hep C testing: will plan when does blood work in the future Screening Labs: reviewed from 10/2020   reports that she has never smoked. She has never used smokeless tobacco. She reports that she does not currently use alcohol. She reports that she does not currently use drugs.  No past medical history on file.  No past surgical history on file.  Current Outpatient Medications  Medication Sig Dispense Refill   Multiple Vitamins-Minerals (WOMENS MULTI PO) Take by mouth.     No current facility-administered medications for this visit.    Family History  Problem Relation Age of Onset   Stroke Mother    Hypertension Mother    Diabetes Mother    Congestive Heart Failure Father    Heart disease Father    Hypertension Father    COPD Paternal Grandmother     Review of Systems  All other systems reviewed and are negative.  Exam:   BP (!) 144/75 (BP Location: Right Arm, Patient Position: Sitting, Cuff Size: Large)   Pulse 79   Ht 5' 4.5" (1.638 m)   Wt 196 lb 12.8 oz (89.3 kg)   BMI 33.26 kg/m   Height: 5' 4.5" (163.8 cm)  General appearance: alert, cooperative and appears stated age Head: Normocephalic, without obvious abnormality, atraumatic Neck: no adenopathy, supple, symmetrical, trachea midline and thyroid normal to inspection and palpation Lungs: clear to auscultation bilaterally Breasts: normal appearance, no masses or tenderness Heart: regular rate and rhythm Abdomen:  soft, non-tender; bowel sounds normal; no masses,  no organomegaly Extremities: extremities normal, atraumatic, no cyanosis or edema Skin: Skin color, texture, turgor normal. No rashes or lesions Lymph nodes: Cervical, supraclavicular, and axillary nodes normal. No abnormal inguinal nodes palpated Neurologic: Grossly normal   Pelvic: External genitalia:  no lesions              Urethra:  normal appearing urethra with no masses, tenderness or lesions              Bartholins and Skenes: normal                 Vagina: normal appearing vagina with normal color and no discharge, no lesions              Cervix: no lesions              Pap taken: Yes.   Bimanual Exam:  Uterus:  normal size, contour, position, consistency, mobility, non-tender              Adnexa: normal adnexa and no mass, fullness, tenderness               Rectovaginal: Confirms               Anus:  normal sphincter tone, no lesions  Chaperone, Ina Homes, CMA, was present for exam.  Assessment/Plan: 1. Well woman exam with routine gynecological exam - Pap smear  and HR HPV obtained today - order for MMG placed - colon cancer screening guidelines reviewed - lab work done 10/2020.  Will need Hep C and HIV with next blood work - vaccines updated  2. Elevated blood pressure reading in office without diagnosis of hypertension - pt is going to monitor  3. Encounter for screening mammogram for malignant neoplasm of breast - MM 3D SCREEN BREAST BILATERAL; Future

## 2021-04-21 NOTE — Patient Instructions (Signed)
Over th counter [facility] nightly

## 2021-04-21 NOTE — Progress Notes (Signed)
Medical Nutrition Therapy  Appointment Start time:  1400  Appointment End time:  1430  Primary concerns today: Blood pressure, weight loss  Referral diagnosis: R03.0 Elevated blood pressure reading in office without diagnosis of hypertension, Z68.41 BMI 40.0 -44.9, Adult Preferred learning style: No preference indicated Learning readiness: Change in progress   NUTRITION ASSESSMENT   Anthropometrics  Ht:5'5" Wt: 196.5 lbs Wt Change: - 28 lbs since 12/22/2020 Body mass index is 32.7 kg/m.   Clinical Medical Hx: Heart palpitations, Vitamin D deficiency Medications: N/A Labs: LDL -111 (high), Lipase - 5 (low) Notable Signs/Symptoms: N/A  Lifestyle & Dietary Hx Pt is still logging foods daily, brought log into appointment. Pt is very consistent in falling near their goal of ~1,600 kcal/day. Pt blood pressure readings have been around 135/80 at home. Pt is walking every day with their dog for about an hour, and is doing resistance training at the gym every other day. Pt is doing targeted muscle group workouts but is considering full body exercises. Pt is getting over 10,000 steps each day. Pt is incorporating more soluble fiber into their diet (mangoes, brussels sprouts, asparagus). Pt has been trying out there air fryer more. Pt reports eliminating sodas has been their biggest accomplishment. Pt reports their biggest challenge has been choosing the right foods to eat when eating away from home.    Estimated daily fluid intake: 64 oz Supplements: Women's MV Sleep: Sleeps well Stress / self-care: Moderate, not too bad Current average weekly physical activity: Walks with dog daily ~45 minutes, 2x days resistance training   24-Hr Dietary Recall First Meal: Cheerios, 1% milk Snack: banana Second Meal: 3 Roasted [place] wrap & low-fat cheese rolls, harvest snaps pea crisps Snack: none Third Meal: [place]ia, parmesan garlic red potatoes& green beans Snack: brownie, popcorn Beverages:  Lemonade, water   NUTRITION DIAGNOSIS  NB-1.1 Food and nutrition-related knowledge deficit As related to elevated blood pressure.  As evidenced by blood pressure reading of 156/100, previous sedentary lifestly, and intake high in sodium,.   NUTRITION INTERVENTION  Nutrition education (E-1) on the following topics:  Educated patient on an appropriate blood pressure goal of <120/80. Educated patient on the relationship between dietary sodium intake and hypertension. Recommended between 1,500 and 2,000 mg of sodium per day. Educated patient on common sources of high sodium foods including packaged/processed foods, deli meats, fast foods, pickled food, sports drinks, and canned foods. Educated patient on the potential for kidney and heart complications related to uncontrolled hypertension. Educated patient on the combined effect of hypertension and elevated cholesterol on cardiovascular health. Educated patient on the positive impact of physical activity in lowering blood pressure and improving cardiovascular health.  Educate pt on the difference between LDL and HDL cholesterol. Educate pt on the factors that can increase and decrease HDL cholesterol. Including exercise to increase HDL, and tobacco use to decrease HDL. Educate pt on factors that can elevate LDL cholesterol, including high dietary intake of saturated fats. Educate pt on identifying sources of saturated fats, and how to make alternative food choices to lower saturated fat intake. Educate pt on the role of soluble fiber in binding to cholesterol in the GI tract an eliminating it from the body. Educate pt on dietary sources of soluble fiber. Educate pt on the potential dietary causes of hypertriglyceridemia.Educate on the role of elevated LDL,total cholesterol, and triglycerides on cardiovascular health. Educate pt on the role of physical activity in lowering LDL and increasing HDL cholesterol.   Handouts Provided Include  White Bean  Bruschetta Recipe Hypertension Nutrition Therapy Nutrition Care Manual Soluble Fiber List Nutrition Care Manual NEW: Eating Before Exercise NEW: Eating for Recovery.  Learning Style & Readiness for Change Teaching method utilized: Visual & Auditory  Demonstrated degree of understanding via: Teach Back  Barriers to learning/adherence to lifestyle change: None  Goals Established by Pt Keep your calories between 1600-1800 per day to continue your rate of healthy weigh loss. Consume between 90-100 grams of protein. Try the Bird's Eye "Steam in Bag" brussell sprouts. Have a high carb, moderate protein snack before exercising, and a fast digesting protein right after exercising.  Use your handouts for great ideas for pre- and post- workouts snacks/meals.   MONITORING & EVALUATION Dietary intake, weekly physical activity, sodium and calcium intake in 3 months.  Next Steps  Patient is to follow up with RDN.

## 2021-04-21 NOTE — Progress Notes (Signed)
   New Patient   Subjective  Kelly Harris is a 40 y.o. female who presents for the following: Annual Exam (Here for annual skin exam. No history of skin cancer or family history. /Wants a suggestion for skin care routine/products. ).   The following portions of the chart were reviewed this encounter and updated as appropriate:  Tobacco  Allergies  Meds  Problems  Med Hx  Surg Hx  Fam Hx      Objective  Well appearing patient in no apparent distress; mood and affect are within normal limits.  A full examination was performed including scalp, head, eyes, ears, nose, lips, neck, chest, axillae, abdomen, back, buttocks, bilateral upper extremities, bilateral lower extremities, hands, feet, fingers, toes, fingernails, and toenails. All findings within normal limits unless otherwise noted below.  Neck - Anterior, and left outer eye Small peduncle lesions.   Assessment & Plan  Skin tag Neck - Anterior, and left outer eye  If patient wants skin tags removed call insurance and waiver to be signed with 30 minute visit.     I, Velma Hanna, PA-C, have reviewed all documentation's for this visit.  The documentation on 04/21/21 for the exam, diagnosis, procedures and orders are all accurate and complete.

## 2021-04-24 ENCOUNTER — Encounter (HOSPITAL_BASED_OUTPATIENT_CLINIC_OR_DEPARTMENT_OTHER): Payer: Self-pay | Admitting: Obstetrics & Gynecology

## 2021-04-27 LAB — CYTOLOGY - PAP
Comment: NEGATIVE
Diagnosis: NEGATIVE
Diagnosis: REACTIVE
High risk HPV: NEGATIVE

## 2021-05-11 ENCOUNTER — Ambulatory Visit (HOSPITAL_BASED_OUTPATIENT_CLINIC_OR_DEPARTMENT_OTHER): Payer: BC Managed Care – PPO | Admitting: Family Medicine

## 2021-05-23 ENCOUNTER — Ambulatory Visit (HOSPITAL_BASED_OUTPATIENT_CLINIC_OR_DEPARTMENT_OTHER): Payer: BC Managed Care – PPO | Admitting: Family Medicine

## 2021-05-23 ENCOUNTER — Encounter (HOSPITAL_BASED_OUTPATIENT_CLINIC_OR_DEPARTMENT_OTHER): Payer: Self-pay | Admitting: Family Medicine

## 2021-05-23 ENCOUNTER — Other Ambulatory Visit: Payer: Self-pay

## 2021-05-23 VITALS — BP 128/78 | HR 73 | Ht 65.0 in | Wt 189.6 lb

## 2021-05-23 DIAGNOSIS — R030 Elevated blood-pressure reading, without diagnosis of hypertension: Secondary | ICD-10-CM

## 2021-05-23 DIAGNOSIS — E669 Obesity, unspecified: Secondary | ICD-10-CM | POA: Diagnosis not present

## 2021-05-23 NOTE — Progress Notes (Signed)
    Procedures performed today:    None.  Independent interpretation of notes and tests performed by another provider:   None.  Brief History, Exam, Impression, and Recommendations:    BP 128/78   Pulse 73   Ht 5\' 5"  (1.651 m)   Wt 189 lb 9.6 oz (86 kg)   SpO2 98%   BMI 31.55 kg/m   Elevated blood pressure reading in office without diagnosis of hypertension Patient has home blood pressure log with her today.  Reviewed, most readings are at goal with readings that are in the 130 systolic and 80s diastolic.  Occasional readings above this in the 140s/90s. Patient denies any issues with chest pain, lightheadedness, dizziness or headaches She continues with her lifestyle modifications, gradually increasing the amount and intensity of exercise that she is doing, both aerobic and anaerobic Continues with dietary adjustments as well Overall she is down about 40 or so pounds from when she initiated lifestyle modifications Encourage patient to continue with lifestyle modifications, also discussed focusing on DASH diet Patient continues to follow with nutritionist, encouraged to do so  Obesity, Class I, BMI 30-34.9 As above, patient continues to work on lifestyle modifications, encouraged her to continue this She has been able to lose about 40 or 7 pounds since initiating lifestyle modifications Continue to monitor at future visits  Plan for follow-up in about 5 to 6 months to monitor blood pressure, weight.  Likely plan to arrange for subsequent labs and CPE after next visit   ___________________________________________ Daysy Santini de , MD, ABFM, CAQSM Primary Care and Sports Medicine [facility]

## 2021-05-23 NOTE — Patient Instructions (Signed)
  Medication Instructions:  Your physician recommends that you continue on your current medications as directed. Please refer to the Current Medication list given to you today. --If you need a refill on any your medications before your next appointment, please call your pharmacy first. If no refills are authorized on file call the office.-- Follow-Up: Your next appointment:   Your physician recommends that you schedule a follow-up appointment in: 5-6 MONTHS with Dr. de [place]  You will receive a text message or e-mail with a link to a survey about your care and experience with [place] today! We would greatly appreciate your feedback!   Thanks for letting us be apart of your health journey!!  Primary Care and Sports Medicine   Dr. Ceasar Mons [place]   We encourage you to activate your patient portal called "MyChart".  Sign up information is provided on this After Visit Summary.  MyChart is used to connect with patients for Virtual Visits (Telemedicine).  Patients are able to view lab/test results, encounter notes, upcoming appointments, etc.  Non-urgent messages can be sent to your provider as well. To learn more about what you can do with MyChart, please visit --  [email or web address].

## 2021-05-23 NOTE — Assessment & Plan Note (Signed)
As above, patient continues to work on lifestyle modifications, encouraged her to continue this She has been able to lose about 40 or 7 pounds since initiating lifestyle modifications Continue to monitor at future visits

## 2021-05-23 NOTE — Assessment & Plan Note (Signed)
Patient has home blood pressure log with her today.  Reviewed, most readings are at goal with readings that are in the 130 systolic and 80s diastolic.  Occasional readings above this in the 140s/90s. Patient denies any issues with chest pain, lightheadedness, dizziness or headaches She continues with her lifestyle modifications, gradually increasing the amount and intensity of exercise that she is doing, both aerobic and anaerobic Continues with dietary adjustments as well Overall she is down about 40 or so pounds from when she initiated lifestyle modifications Encourage patient to continue with lifestyle modifications, also discussed focusing on DASH diet Patient continues to follow with nutritionist, encouraged to do so

## 2021-07-19 ENCOUNTER — Encounter: Payer: BC Managed Care – PPO | Attending: Family Medicine | Admitting: Dietician

## 2021-07-19 ENCOUNTER — Encounter: Payer: Self-pay | Admitting: Dietician

## 2021-07-19 ENCOUNTER — Other Ambulatory Visit: Payer: Self-pay

## 2021-07-19 DIAGNOSIS — R030 Elevated blood-pressure reading, without diagnosis of hypertension: Secondary | ICD-10-CM | POA: Insufficient documentation

## 2021-07-19 NOTE — Patient Instructions (Addendum)
Have a piece of fruit with your yogurt for breakfast on days that you are rushing out of the house.  Look into SpiceWalla seasonings for salt-free seasoning.  If having processed foods that are higher in sodium, eat them on an exercise day and stick to the serving size.  KEEP UP THE AMAZING WORK! YOU CONTINUE TO IMPRESS ME. I AM VERY PROUD OF YOUR PROGRESS!

## 2021-07-19 NOTE — Progress Notes (Signed)
Medical Nutrition Therapy  Appointment Start time:  [phone]  Appointment End time:  1650  Primary concerns today: Blood pressure, weight loss  Referral diagnosis: R03.0 Elevated blood pressure reading in office without diagnosis of hypertension, Z68.41 BMI 40.0 -44.9, Adult Preferred learning style: No preference indicated Learning readiness: Change in progress   NUTRITION ASSESSMENT   Anthropometrics  Ht:5'5" Wt: 178.6 lbs Wt Change: - 46 lbs since 12/22/2020 Body mass index is 29.72 kg/m.   Clinical Medical Hx: Heart palpitations, Vitamin D deficiency Medications: N/A Labs: LDL -111 (high), Lipase - 5 (low) Notable Signs/Symptoms: N/A   Lifestyle & Dietary Hx Pt has lost almost 50 lbs since May, 2022. Pt is still extremely motivated. Pt is still logging their foods., getting around 1,600 kcal per day. Pt reports not feeling that they are restricting their foods and that they can eat whatever they want in moderation. Pt has been exercising at home when they can't get to the gym. Pt makes it to the gym twice a week, walking their dog for 45 minutes. Pt has been challenging themselves with exercise. Pt has been eating to fuel and recover with their exercise, loves low-fat chocolate milk after a workout. Pt reports blood pressure is improving, reading was 115/84.   Estimated daily fluid intake: 64 oz Supplements: Women's MV Sleep: Sleeps well Stress / self-care: Moderate, not too bad Current average weekly physical activity: Walks with dog daily ~45 minutes, 2x days resistance training   24-Hr Dietary Recall First Meal: Greek yogurt Snack:  Second Meal: Salad with bell, pepper, mushrooms, carrots, cucumbers, avocado, shredded chicken Snack: none Third Meal: Salmon patty, brussell sprouts, jasmine rice Snack: brownie, popcorn Beverages: Water   NUTRITION DIAGNOSIS  NB-1.1 Food and nutrition-related knowledge deficit As related to elevated blood pressure.  As evidenced by  blood pressure reading of 156/100, previous sedentary lifestly, and intake high in sodium,.   NUTRITION INTERVENTION  Nutrition education (E-1) on the following topics:  Educated patient on an appropriate blood pressure goal of <120/80. Educated patient on the relationship between dietary sodium intake and hypertension. Recommended between 1,500 and 2,000 mg of sodium per day. Educated patient on common sources of high sodium foods including packaged/processed foods, deli meats, fast foods, pickled food, sports drinks, and canned foods. Educated patient on the potential for kidney and heart complications related to uncontrolled hypertension. Educated patient on the combined effect of hypertension and elevated cholesterol on cardiovascular health. Educated patient on the positive impact of physical activity in lowering blood pressure and improving cardiovascular health.  Educate pt on the difference between LDL and HDL cholesterol. Educate pt on the factors that can increase and decrease HDL cholesterol. Including exercise to increase HDL, and tobacco use to decrease HDL. Educate pt on factors that can elevate LDL cholesterol, including high dietary intake of saturated fats. Educate pt on identifying sources of saturated fats, and how to make alternative food choices to lower saturated fat intake. Educate pt on the role of soluble fiber in binding to cholesterol in the GI tract an eliminating it from the body. Educate pt on dietary sources of soluble fiber. Educate pt on the potential dietary causes of hypertriglyceridemia.Educate on the role of elevated LDL,total cholesterol, and triglycerides on cardiovascular health. Educate pt on the role of physical activity in lowering LDL and increasing HDL cholesterol.   Handouts Provided Include  White Bean Bruschetta Recipe Hypertension Nutrition Therapy Nutrition Care Manual Soluble Fiber List Nutrition Care Manual Eating Before Exercise Eating for  Recovery.  Learning Style & Readiness for Change Teaching method utilized: Visual & Auditory  Demonstrated degree of understanding via: Teach Back  Barriers to learning/adherence to lifestyle change: None  Goals Established by Pt Have a piece of fruit with your yogurt for breakfast on days that you are rushing out of the house. Look into SpiceWalla seasonings for salt-free seasoning. If having processed foods that are higher in sodium, eat them on an exercise day and stick to the serving size. KEEP UP THE AMAZING WORK! YOU CONTINUE TO IMPRESS ME. I AM VERY PROUD OF YOUR PROGRESS!   MONITORING & EVALUATION Dietary intake, weekly physical activity, sodium and calcium intake in 6 months.  Next Steps  Patient is to follow up with RDN.

## 2021-11-21 ENCOUNTER — Encounter (HOSPITAL_BASED_OUTPATIENT_CLINIC_OR_DEPARTMENT_OTHER): Payer: Self-pay

## 2021-11-21 ENCOUNTER — Ambulatory Visit (HOSPITAL_BASED_OUTPATIENT_CLINIC_OR_DEPARTMENT_OTHER): Payer: BC Managed Care – PPO | Admitting: Family Medicine

## 2021-11-21 ENCOUNTER — Encounter (HOSPITAL_BASED_OUTPATIENT_CLINIC_OR_DEPARTMENT_OTHER): Payer: Self-pay | Admitting: Family Medicine

## 2021-11-24 ENCOUNTER — Encounter (HOSPITAL_BASED_OUTPATIENT_CLINIC_OR_DEPARTMENT_OTHER): Payer: Self-pay | Admitting: Family Medicine

## 2021-11-24 ENCOUNTER — Ambulatory Visit (HOSPITAL_BASED_OUTPATIENT_CLINIC_OR_DEPARTMENT_OTHER): Payer: BC Managed Care – PPO | Admitting: Family Medicine

## 2021-11-24 VITALS — BP 128/88 | HR 86 | Temp 98.7°F | Ht 64.0 in | Wt 156.8 lb

## 2021-11-24 DIAGNOSIS — E663 Overweight: Secondary | ICD-10-CM | POA: Insufficient documentation

## 2021-11-24 DIAGNOSIS — J3089 Other allergic rhinitis: Secondary | ICD-10-CM

## 2021-11-24 DIAGNOSIS — Z0000 Encounter for general adult medical examination without abnormal findings: Secondary | ICD-10-CM

## 2021-11-24 DIAGNOSIS — R030 Elevated blood-pressure reading, without diagnosis of hypertension: Secondary | ICD-10-CM | POA: Diagnosis not present

## 2021-11-24 NOTE — Assessment & Plan Note (Signed)
Patient has been monitoring blood pressure at home, home readings generally have been well controlled with systolic in the 120s and diastolic in the 70s ?She denies any issues with chest pain, headaches ?Has been working on lifestyle modifications, continues to gradually lose weight ?Recommend continuing with regular aerobic exercise, working towards gradual weight loss ?

## 2021-11-24 NOTE — Progress Notes (Signed)
? ? ?  Procedures performed today:   ? ?None. ? ?Independent interpretation of notes and tests performed by another provider:  ? ?None. ? ?Brief History, Exam, Impression, and Recommendations:   ? ?BP 128/88   Pulse 86   Temp 98.7 ?F (37.1 ?C)   Ht 5\' 4"  (1.626 m)   Wt 156 lb 12.8 oz (71.1 kg)   SpO2 97%   BMI 26.91 kg/m?  ? ?Elevated blood pressure reading in office without diagnosis of hypertension ?Patient has been monitoring blood pressure at home, home readings generally have been well controlled with systolic in the 120s and diastolic in the 70s ?She denies any issues with chest pain, headaches ?Has been working on lifestyle modifications, continues to gradually lose weight ?Recommend continuing with regular aerobic exercise, working towards gradual weight loss ? ?Overweight (BMI 25.0-29.9) ?Patient has been doing excellently with lifestyle modifications and working on weight loss.  Her BMI is now less than 27.  She overall is feeling great, blood pressure is well controlled, is exercising regularly ?In regards to eventual goal, discussed that ideally would like to achieve BMI within normal range less than 25, this would require about a further 10 pounds in weight loss, however can continue with gradual improvement in this.  Also discussed notable benefit related to regular exercise regardless of weight changes ?She will continue to work on lifestyle modifications and gradual weight loss ?She also has a follow-up visit with nutritionist upcoming ? ?Environmental and seasonal allergies ?Has been having some increased allergy symptoms, primarily feels that it is related to intermittent pressure and clogging in the ears.  She has utilized some OTC measures in the past, discussed options including oral antihistamine, intranasal steroid spray and that she can consider using these to help with control of symptoms.  Use of medications does not need to be year-round and can be considered when symptoms are  increased ? ?Plan for follow-up in 2 to 3 months for CPE, nurse visit for labs 1 week prior ? ? ?___________________________________________ ?Kelly Harris de 06-16-2003, MD, ABFM, CAQSM ?Primary Care and Sports Medicine ?[facility] ?

## 2021-11-24 NOTE — Assessment & Plan Note (Signed)
Has been having some increased allergy symptoms, primarily feels that it is related to intermittent pressure and clogging in the ears.  She has utilized some OTC measures in the past, discussed options including oral antihistamine, intranasal steroid spray and that she can consider using these to help with control of symptoms.  Use of medications does not need to be year-round and can be considered when symptoms are increased ?

## 2021-11-24 NOTE — Assessment & Plan Note (Signed)
Patient has been doing excellently with lifestyle modifications and working on weight loss.  Her BMI is now less than 27.  She overall is feeling great, blood pressure is well controlled, is exercising regularly ?In regards to eventual goal, discussed that ideally would like to achieve BMI within normal range less than 25, this would require about a further 10 pounds in weight loss, however can continue with gradual improvement in this.  Also discussed notable benefit related to regular exercise regardless of weight changes ?She will continue to work on lifestyle modifications and gradual weight loss ?She also has a follow-up visit with nutritionist upcoming ?

## 2021-11-24 NOTE — Patient Instructions (Signed)
?  Medication Instructions:  ?Your physician recommends that you continue on your current medications as directed.  ? ? ?Follow-Up: ?Your next appointment:   ?Your physician recommends that you schedule a follow-up appointment in: 3 month CPE  labs one week prior to with Dr. Tommi Rumps [place] ? ?You will receive a text message or e-mail with a link to a survey about your care and experience with [place] today! We would greatly appreciate your feedback!  ? ?Thanks for letting us be apart of your health journey!!  ?Primary Care and Sports Medicine  ? ?Dr. Marcy Salvo de [place]  ? ?We encourage you to activate your patient portal called "MyChart".  Sign up information is provided on this After Visit Summary.  MyChart is used to connect with patients for Virtual Visits (Telemedicine).  Patients are able to view lab/test results, encounter notes, upcoming appointments, etc.  Non-urgent messages can be sent to your provider as well. To learn more about what you can do with MyChart, please visit --  [email or web address].   ? ?

## 2021-11-25 ENCOUNTER — Encounter (HOSPITAL_BASED_OUTPATIENT_CLINIC_OR_DEPARTMENT_OTHER): Payer: Self-pay | Admitting: Family Medicine

## 2021-11-29 NOTE — Telephone Encounter (Signed)
Our credentialing team is already handling this and the ins. is suppose to being fixing this ?

## 2021-12-05 ENCOUNTER — Other Ambulatory Visit (HOSPITAL_BASED_OUTPATIENT_CLINIC_OR_DEPARTMENT_OTHER): Payer: Self-pay | Admitting: Obstetrics & Gynecology

## 2021-12-05 ENCOUNTER — Other Ambulatory Visit (HOSPITAL_BASED_OUTPATIENT_CLINIC_OR_DEPARTMENT_OTHER): Payer: Self-pay

## 2021-12-05 DIAGNOSIS — Z1231 Encounter for screening mammogram for malignant neoplasm of breast: Secondary | ICD-10-CM

## 2022-01-19 ENCOUNTER — Ambulatory Visit: Payer: BC Managed Care – PPO | Admitting: Dietician

## 2022-02-16 ENCOUNTER — Ambulatory Visit (HOSPITAL_BASED_OUTPATIENT_CLINIC_OR_DEPARTMENT_OTHER): Payer: BC Managed Care – PPO

## 2022-02-16 ENCOUNTER — Encounter (HOSPITAL_BASED_OUTPATIENT_CLINIC_OR_DEPARTMENT_OTHER): Payer: Self-pay

## 2022-02-16 DIAGNOSIS — Z0000 Encounter for general adult medical examination without abnormal findings: Secondary | ICD-10-CM

## 2022-02-16 DIAGNOSIS — Z1231 Encounter for screening mammogram for malignant neoplasm of breast: Secondary | ICD-10-CM

## 2022-02-17 LAB — CBC WITH DIFFERENTIAL/PLATELET
Basophils Absolute: 0.1 10*3/uL (ref 0.0–0.2)
Basos: 1 %
EOS (ABSOLUTE): 0.1 10*3/uL (ref 0.0–0.4)
Eos: 2 %
Hematocrit: 39.2 % (ref 34.0–46.6)
Hemoglobin: 12.9 g/dL (ref 11.1–15.9)
Immature Grans (Abs): 0 10*3/uL (ref 0.0–0.1)
Immature Granulocytes: 0 %
Lymphocytes Absolute: 2.3 10*3/uL (ref 0.7–3.1)
Lymphs: 37 %
MCH: 30 pg (ref 26.6–33.0)
MCHC: 32.9 g/dL (ref 31.5–35.7)
MCV: 91 fL (ref 79–97)
Monocytes Absolute: 0.5 10*3/uL (ref 0.1–0.9)
Monocytes: 8 %
Neutrophils Absolute: 3.2 10*3/uL (ref 1.4–7.0)
Neutrophils: 52 %
Platelets: 235 10*3/uL (ref 150–450)
RBC: 4.3 x10E6/uL (ref 3.77–5.28)
RDW: 12.7 % (ref 11.7–15.4)
WBC: 6.1 10*3/uL (ref 3.4–10.8)

## 2022-02-17 LAB — COMPREHENSIVE METABOLIC PANEL
ALT: 42 IU/L — ABNORMAL HIGH (ref 0–32)
AST: 40 IU/L (ref 0–40)
Albumin/Globulin Ratio: 1.8 (ref 1.2–2.2)
Albumin: 4.5 g/dL (ref 3.8–4.8)
Alkaline Phosphatase: 75 IU/L (ref 44–121)
BUN/Creatinine Ratio: 16 (ref 9–23)
BUN: 13 mg/dL (ref 6–24)
Bilirubin Total: 0.5 mg/dL (ref 0.0–1.2)
CO2: 21 mmol/L (ref 20–29)
Calcium: 9.7 mg/dL (ref 8.7–10.2)
Chloride: 100 mmol/L (ref 96–106)
Creatinine, Ser: 0.83 mg/dL (ref 0.57–1.00)
Globulin, Total: 2.5 g/dL (ref 1.5–4.5)
Glucose: 91 mg/dL (ref 70–99)
Potassium: 4 mmol/L (ref 3.5–5.2)
Sodium: 136 mmol/L (ref 134–144)
Total Protein: 7 g/dL (ref 6.0–8.5)
eGFR: 90 mL/min/{1.73_m2} (ref >59)

## 2022-02-17 LAB — LIPID PANEL
Chol/HDL Ratio: 2.9 ratio (ref 0.0–4.4)
Cholesterol, Total: 198 mg/dL (ref 100–199)
HDL: 69 mg/dL (ref >39)
LDL Chol Calc (NIH): 118 mg/dL — ABNORMAL HIGH (ref 0–99)
Triglycerides: 62 mg/dL (ref 0–149)
VLDL Cholesterol Cal: 11 mg/dL (ref 5–40)

## 2022-02-17 LAB — TSH RFX ON ABNORMAL TO FREE T4: TSH: 3.32 u[IU]/mL (ref 0.450–4.500)

## 2022-02-17 LAB — HEMOGLOBIN A1C
Est. average glucose Bld gHb Est-mCnc: 100 mg/dL
Hgb A1c MFr Bld: 5.1 % (ref 4.8–5.6)

## 2022-02-20 ENCOUNTER — Ambulatory Visit: Payer: BC Managed Care – PPO | Admitting: Skilled Nursing Facility1

## 2022-02-23 ENCOUNTER — Encounter (HOSPITAL_BASED_OUTPATIENT_CLINIC_OR_DEPARTMENT_OTHER): Payer: BC Managed Care – PPO | Admitting: Family Medicine

## 2022-03-09 ENCOUNTER — Ambulatory Visit: Payer: BC Managed Care – PPO | Admitting: Physician Assistant

## 2022-03-27 ENCOUNTER — Encounter (HOSPITAL_BASED_OUTPATIENT_CLINIC_OR_DEPARTMENT_OTHER): Payer: Self-pay | Admitting: Family Medicine

## 2022-03-27 ENCOUNTER — Ambulatory Visit (INDEPENDENT_AMBULATORY_CARE_PROVIDER_SITE_OTHER): Payer: BC Managed Care – PPO | Admitting: Family Medicine

## 2022-03-27 DIAGNOSIS — Z0000 Encounter for general adult medical examination without abnormal findings: Secondary | ICD-10-CM | POA: Diagnosis not present

## 2022-03-27 NOTE — Assessment & Plan Note (Signed)
Routine HCM labs reviewed. HCM reviewed/discussed. Anticipatory guidance regarding healthy weight, lifestyle and choices given. Recommend healthy diet.  Recommend approximately 150 minutes/week of moderate intensity exercise Recommend regular dental and vision exams Always use seatbelt/lap and shoulder restraints Recommend using smoke alarms and checking batteries at least twice a year Recommend using sunscreen when outside Discussed tetanus immunization recommendations, patient is UTD 

## 2022-03-27 NOTE — Patient Instructions (Signed)
  Medication Instructions:  Your physician recommends that you continue on your current medications as directed. Please refer to the Current Medication list given to you today. --If you need a refill on any your medications before your next appointment, please call your pharmacy first. If no refills are authorized on file call the office.-- Lab Work: Your physician has recommended that you have lab work today: No If you have labs (blood work) drawn today and your tests are completely normal, you will receive your results via MyChart message OR a phone call from our staff.  Please ensure you check your voicemail in the event that you authorized detailed messages to be left on a delegated number. If you have any lab test that is abnormal or we need to change your treatment, we will call you to review the results.  Referrals/Procedures/Imaging: No  Follow-Up: Your next appointment:   Your physician recommends that you schedule a follow-up appointment in: 6 months with Dr. de [place].  You will receive a text message or e-mail with a link to a survey about your care and experience with us today! We would greatly appreciate your feedback!   Thanks for letting us be apart of your health journey!!  Primary Care and Sports Medicine   Dr. Raymond de [place]   We encourage you to activate your patient portal called "MyChart".  Sign up information is provided on this After Visit Summary.  MyChart is used to connect with patients for Virtual Visits (Telemedicine).  Patients are able to view lab/test results, encounter notes, upcoming appointments, etc.  Non-urgent messages can be sent to your provider as well. To learn more about what you can do with MyChart, please visit --  [email or web address].    

## 2022-03-27 NOTE — Progress Notes (Signed)
Subjective:    CC: Annual Physical Exam  HPI:  Briann Sarchet is a 41 y.o. presenting for annual physical  I reviewed the past medical history, family history, social history, surgical history, and allergies today and no changes were needed.  Please see the problem list section below in epic for further details.  Past Medical History: History reviewed. No pertinent past medical history. Past Surgical History: Past Surgical History:  Procedure Laterality Date   PHARYNGEAL FLAP  1989   Social History: Social History   Socioeconomic History   Marital status: Single    Spouse name: Not on file   Number of children: Not on file   Years of education: Not on file   Highest education level: Not on file  Occupational History   Not on file  Tobacco Use   Smoking status: Never   Smokeless tobacco: Never  Vaping Use   Vaping Use: Never used  Substance and Sexual Activity   Alcohol use: Not Currently   Drug use: Not Currently   Sexual activity: Not Currently    Birth control/protection: None  Other Topics Concern   Not on file  Social History Narrative   Not on file   Social Determinants of Health   Financial Resource Strain: Not on file  Food Insecurity: Not on file  Transportation Needs: Not on file  Physical Activity: Not on file  Stress: Not on file  Social Connections: Not on file   Family History: Family History  Problem Relation Age of Onset   Stroke Mother    Hypertension Mother    Diabetes Mother    Congestive Heart Failure Father    Heart disease Father    Hypertension Father    COPD Paternal Grandmother    Allergies: Allergies  Allergen Reactions   Amoxicillin Hives   Medications: See med rec.  Review of Systems: No headache, visual changes, nausea, vomiting, diarrhea, constipation, dizziness, abdominal pain, skin rash, fevers, chills, night sweats, swollen lymph nodes, weight loss, chest pain, body aches, joint swelling, muscle aches, shortness  of breath, mood changes, visual or auditory hallucinations.  Objective:    BP (!) 141/65   Pulse 99   Temp 97.6 F (36.4 C) (Oral)   Ht 5\' 4"  (1.626 m)   Wt 148 lb 6.4 oz (67.3 kg)   SpO2 100%   BMI 25.47 kg/m   General: Well Developed, well nourished, and in no acute distress.  Neuro: Alert and oriented x3, extra-ocular muscles intact, sensation grossly intact. Cranial nerves II through XII are intact, motor, sensory, and coordinative functions are all intact. HEENT: Normocephalic, atraumatic, pupils equal round reactive to light, neck supple, no masses, no lymphadenopathy, thyroid nonpalpable. Oropharynx, nasopharynx, external ear canals are unremarkable. Skin: Warm and dry, no rashes noted.  Cardiac: Regular rate and rhythm, no murmurs rubs or gallops.  Respiratory: Clear to auscultation bilaterally. Not using accessory muscles, speaking in full sentences.  Abdominal: Soft, nontender, nondistended, positive bowel sounds, no masses, no organomegaly.  Musculoskeletal: Shoulder, elbow, wrist, hip, knee, ankle stable, and with full range of motion.  Impression and Recommendations:    Wellness examination Routine HCM labs reviewed. HCM reviewed/discussed. Anticipatory guidance regarding healthy weight, lifestyle and choices given. Recommend healthy diet.  Recommend approximately 150 minutes/week of moderate intensity exercise Recommend regular dental and vision exams Always use seatbelt/lap and shoulder restraints Recommend using smoke alarms and checking batteries at least twice a year Recommend using sunscreen when outside Discussed tetanus immunization recommendations, patient is UTD  Return in about 6 months (around 09/27/2022) for Weight, elevated ALT. Plan to check LFTs with next visit.   ___________________________________________ Yohanna Tow de [place], MD, ABFM, [facility] Primary Care and Sports Medicine [facility]

## 2022-03-28 ENCOUNTER — Other Ambulatory Visit (HOSPITAL_BASED_OUTPATIENT_CLINIC_OR_DEPARTMENT_OTHER): Payer: Self-pay | Admitting: Family Medicine

## 2022-03-28 DIAGNOSIS — R030 Elevated blood-pressure reading, without diagnosis of hypertension: Secondary | ICD-10-CM

## 2022-03-28 DIAGNOSIS — E663 Overweight: Secondary | ICD-10-CM

## 2022-03-29 ENCOUNTER — Encounter: Payer: BC Managed Care – PPO | Attending: Family Medicine | Admitting: Skilled Nursing Facility1

## 2022-03-29 DIAGNOSIS — E669 Obesity, unspecified: Secondary | ICD-10-CM | POA: Insufficient documentation

## 2022-03-29 NOTE — Progress Notes (Signed)
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  Pt states her weight started at 240 pounds since last year.   Pt questions if she is done losing weight or if she should lose more and if ehr calories are correct.  Dietitian discussed with pt the appropriate weight for her frame and probably maintaining around 1500-1800 calories. Dietitian advised pt to pivot her goals away form weight and switch to physical activity goals or focusing on other goals she has for her life outside of weight because hyper focusing on weight will lead to an unhealthy relationship with food and her own body. Dietitian discussed with the pt the pitfalls of not trusting her bodies cues and having confidence in her food choices leading to under consumption.   Pt states she sees where she kept moving the goal post to a lower weight as she kept reaching weight goals.     Body Composition Scale 03/29/2022  Current Body Weight 147.8  Total Body Fat % 27.9  Visceral Fat 6  Fat-Free Mass % 72   Total Body Water % 50.5  Muscle-Mass lbs 31.3  BMI 24.8  Body Fat Displacement          Torso  lbs 25.4         Left Leg  lbs 5.0         Right Leg  lbs 5.0         Left Arm  lbs 2.5         Right Arm   lbs 2.5      Estimated daily fluid intake: 64 oz Supplements: Women's MV Sleep: Sleeps well Stress / self-care: Moderate, not too bad Current average weekly physical activity:  Walks with dog daily ~45 minutes, 2x days resistance training   24-Hr Dietary Recall First Meal: kodiak frozen waffle with fairlife milk or greek yogurt with berries pr apple or scrambled egg + fairlife milk + fruit Snack:  Second Meal: homemade chicken meatball with raos sauce or chicken shortcuts with frozen vegetables or salmon patty with veggies or beets and feta Snack: none Third Meal: homade pizza + mushrooms + [place] peppornoni + shredded cheese or fish Snack: brownie, popcorn Beverages: Water, fairlife milk   NUTRITION DIAGNOSIS  NB-1.1 Food and nutrition-related knowledge deficit As related to elevated blood pressure.  As evidenced by blood pressure reading of 156/100, previous sedentary lifestly, and intake high in sodium,.   NUTRITION INTERVENTION  Nutrition education (E-1) on the following topics:  Educated patient on an appropriate blood pressure goal of <120/80. Educated patient on the relationship between dietary sodium intake and hypertension. Recommended between 1,500 and 2,000 mg of sodium per day. Educated patient on common sources of high sodium foods including packaged/processed foods, deli meats, fast foods, pickled food, sports drinks, and canned foods. Educated patient on the potential for kidney and heart complications related to uncontrolled hypertension. Educated patient on the combined effect of hypertension and elevated cholesterol on cardiovascular health. Educated patient on the positive impact  of physical activity in lowering blood pressure and improving cardiovascular health.  Educate pt on the difference between LDL and HDL cholesterol. Educate pt on the factors that can increase and decrease HDL cholesterol. Including exercise to increase HDL, and tobacco use to decrease HDL. Educate pt on factors that can elevate LDL cholesterol, including high dietary intake of saturated fats. Educate pt on identifying sources of saturated fats, and how to make alternative  food choices to lower saturated fat intake. Educate pt on the role of soluble fiber in binding to cholesterol in the GI tract an eliminating it from the body. Educate pt on dietary sources of soluble fiber. Educate pt on the potential dietary causes of hypertriglyceridemia.Educate on the role of elevated LDL,total cholesterol, and triglycerides on cardiovascular health. Educate pt on the role of physical activity in lowering LDL and increasing HDL cholesterol. Encouraged patient to honor their body's internal hunger and fullness cues.  Throughout the day, check in mentally and rate hunger. Stop eating when satisfied not full regardless of how much food is left on the plate.  Get more if still hungry 20-30 minutes later.  The key is to honor satisfaction so throughout the meal, rate fullness factor and stop when comfortably satisfied not physically full. The key is to honor hunger and fullness without any feelings of guilt or shame.  Pay attention to what the internal cues are, rather than any external factors. This will enhance the confidence you have in listening to your own body and following those internal cues enabling you to increase how often you eat when you are hungry not out of appetite and stop when you are satisfied not full.  Encouraged pt to continue to eat balanced meals inclusive of non starchy vegetables 2 times a day 7 days a week Encouraged pt to choose lean protein sources: limiting beef, pork, sausage, hotdogs, and lunch meat Encourage pt to choose healthy fats such as plant based limiting animal fats Encouraged pt to continue to drink a minium 64 fluid ounces with half being plain water to satisfy proper hydration    Handouts Previously Provided Include  White Bean Bruschetta Recipe Hypertension Nutrition Therapy Nutrition Care Manual Soluble Fiber List Nutrition Care Manual Eating Before Exercise Eating for Recovery.  Learning Style & Readiness for Change Teaching method  utilized: Visual & Auditory  Demonstrated degree of understanding via: Teach Back  Barriers to learning/adherence to lifestyle change: fear of weight gain  Goals Established by Pt Pivot your goals away from weight  Trust in your bodies cues  You are in weight maintenance now Your diet is great! Include resistance activities 2-3 times a week   MONITORING & EVALUATION Dietary intake, weekly physical activity, sodium and calcium intake in 6 months.  Next Steps  Patient is to call or email with any future questions or concerns

## 2022-05-12 ENCOUNTER — Ambulatory Visit (HOSPITAL_BASED_OUTPATIENT_CLINIC_OR_DEPARTMENT_OTHER): Payer: BC Managed Care – PPO | Admitting: Radiology

## 2022-05-12 ENCOUNTER — Ambulatory Visit (HOSPITAL_BASED_OUTPATIENT_CLINIC_OR_DEPARTMENT_OTHER): Payer: BC Managed Care – PPO | Admitting: Obstetrics & Gynecology

## 2022-10-02 ENCOUNTER — Other Ambulatory Visit (HOSPITAL_BASED_OUTPATIENT_CLINIC_OR_DEPARTMENT_OTHER): Payer: Self-pay | Admitting: Obstetrics & Gynecology

## 2022-10-02 ENCOUNTER — Encounter (HOSPITAL_BASED_OUTPATIENT_CLINIC_OR_DEPARTMENT_OTHER): Payer: Self-pay | Admitting: Family Medicine

## 2022-10-02 ENCOUNTER — Ambulatory Visit (HOSPITAL_BASED_OUTPATIENT_CLINIC_OR_DEPARTMENT_OTHER): Payer: BC Managed Care – PPO | Admitting: Family Medicine

## 2022-10-02 ENCOUNTER — Ambulatory Visit (HOSPITAL_BASED_OUTPATIENT_CLINIC_OR_DEPARTMENT_OTHER): Payer: BC Managed Care – PPO | Admitting: Obstetrics & Gynecology

## 2022-10-02 ENCOUNTER — Telehealth (HOSPITAL_BASED_OUTPATIENT_CLINIC_OR_DEPARTMENT_OTHER): Payer: Self-pay | Admitting: Family Medicine

## 2022-10-02 ENCOUNTER — Ambulatory Visit (HOSPITAL_BASED_OUTPATIENT_CLINIC_OR_DEPARTMENT_OTHER)
Admission: RE | Admit: 2022-10-02 | Discharge: 2022-10-02 | Disposition: A | Discharge status: Home / Self Care | Payer: BC Managed Care – PPO | Source: Ambulatory Visit | Attending: Obstetrics & Gynecology | Admitting: Obstetrics & Gynecology

## 2022-10-02 VITALS — BP 128/81 | HR 85 | Ht 64.5 in | Wt 153.0 lb

## 2022-10-02 DIAGNOSIS — Z1231 Encounter for screening mammogram for malignant neoplasm of breast: Secondary | ICD-10-CM | POA: Diagnosis not present

## 2022-10-02 DIAGNOSIS — R7401 Elevation of levels of liver transaminase levels: Secondary | ICD-10-CM | POA: Diagnosis not present

## 2022-10-02 NOTE — Assessment & Plan Note (Addendum)
ALT 42 in July 2023.  Presents today for follow-up liver function test.  Patient denies drinking alcohol, she has cut back on red meat consumption and only takes Tylenol once per month. No abdominal pain, nausea or vomiting. Will get liver function test today.  She is scheduled for annual wellness exam with GYN, she will follow-up with PCP as needed.

## 2022-10-02 NOTE — Telephone Encounter (Signed)
Pt Advised NO to the FLU Vaccine

## 2022-10-02 NOTE — Progress Notes (Signed)
   Established Patient Office Visit  Subjective   Patient ID: Kelly Harris, female    DOB: 26-Nov-1980  Age: 42 y.o. MRN: [id number]  Chief Complaint  Patient presents with   Follow-up    Pt here for f/u on weight and elevated alt    HPI Presents today for follow-up for weight, not concerned about her weight, she is working on maintaining her current weight. Not following a special diet currently. Has seen nutritionist in past. BMI 25.86.   ALT 42 at  last visit, does not drink alcohol, has cut back on red meat consumption. Takes Tylenol monthly, takes 1000 mg at a time. No abdominal pain, nausea or vomiting.   Will get LFTs today.   Review of Systems  Eyes:  Negative for blurred vision and double vision.  Respiratory:  Negative for shortness of breath.   Cardiovascular:  Negative for chest pain.  Gastrointestinal:  Negative for abdominal pain, nausea and vomiting.  Musculoskeletal:  Negative for myalgias.  Neurological:  Negative for dizziness and headaches.      Objective:     BP 128/81 (BP Location: Left Arm, Patient Position: Sitting, Cuff Size: Large)   Pulse 85   Ht 5' 4.5" (1.638 m)   Wt 153 lb (69.4 kg)   SpO2 99%   BMI 25.86 kg/m  BP Readings from Last 3 Encounters:  10/02/22 128/81  03/27/22 (!) 141/65  11/24/21 128/88      Physical Exam Vitals and nursing note reviewed.  Constitutional:      General: She is not in acute distress.    Appearance: Normal appearance.  Cardiovascular:     Rate and Rhythm: Normal rate and regular rhythm.     Heart sounds: Normal heart sounds.  Pulmonary:     Effort: Pulmonary effort is normal.     Breath sounds: Normal breath sounds.  Skin:    General: Skin is warm and dry.     Capillary Refill: Capillary refill takes less than 2 seconds.  Neurological:     General: No focal deficit present.     Mental Status: She is alert. Mental status is at baseline.  Psychiatric:        Mood and Affect: Mood normal.         Behavior: Behavior normal.        Thought Content: Thought content normal.        Judgment: Judgment normal.      No results found for any visits on 10/02/22.    The 10-year ASCVD risk score (Arnett DK, et al., 2019) is: 0.5%    Assessment & Plan:   Problem List Items Addressed This Visit     Elevated alanine aminotransferase (ALT) level - Primary    ALT 42 in July 2023.  Presents today for follow-up liver function test.  Patient denies drinking alcohol, she has cut back on red meat consumption and only takes Tylenol once per month. No abdominal pain, nausea or vomiting. Will get liver function test today.  She is scheduled for annual wellness exam with GYN, she will follow-up with PCP as needed.      Relevant Orders   Hepatic function panel  Will schedule mammogram soon.  Will notify once LFT results are available.  Agrees with plan of care discussed.  Questions answered.   Return if symptoms worsen or fail to improve.    Chalmers Guest, FNP

## 2022-10-03 LAB — HEPATIC FUNCTION PANEL
ALT: 43 IU/L — ABNORMAL HIGH (ref 0–32)
AST: 30 IU/L (ref 0–40)
Albumin: 4.3 g/dL (ref 3.9–4.9)
Alkaline Phosphatase: 75 IU/L (ref 44–121)
Bilirubin Total: 0.6 mg/dL (ref 0.0–1.2)
Bilirubin, Direct: 0.15 mg/dL (ref 0.00–0.40)
Total Protein: 6.7 g/dL (ref 6.0–8.5)

## 2022-10-16 ENCOUNTER — Encounter (HOSPITAL_BASED_OUTPATIENT_CLINIC_OR_DEPARTMENT_OTHER): Payer: Self-pay | Admitting: Obstetrics & Gynecology

## 2022-10-16 ENCOUNTER — Ambulatory Visit (INDEPENDENT_AMBULATORY_CARE_PROVIDER_SITE_OTHER): Payer: BC Managed Care – PPO | Admitting: Obstetrics & Gynecology

## 2022-10-16 VITALS — BP 140/76 | HR 65 | Ht 64.5 in | Wt 155.4 lb

## 2022-10-16 DIAGNOSIS — Z01419 Encounter for gynecological examination (general) (routine) without abnormal findings: Secondary | ICD-10-CM

## 2022-10-16 DIAGNOSIS — R030 Elevated blood-pressure reading, without diagnosis of hypertension: Secondary | ICD-10-CM | POA: Diagnosis not present

## 2022-10-16 NOTE — Progress Notes (Signed)
42 y.o. G0P0000 Single White or Caucasian female here for annual exam.  Cycles still regular.  Lasts typically 3-4 days with a a couple days of spotting.    BP has been monitored this past year.  She has been monitoring at home.  Will start watching it more at home.    Has lost around 80 pounds.  Has some questions about "belly fat".    Patient's last menstrual period was 09/13/2022 (approximate).          Sexually active: No.  The current method of family planning is abstinence.    Smoker:  no  Health Maintenance: Pap:  04/2021 History of abnormal Pap:  no MMG:  10/02/2022 Colonoscopy:  guidelines reviewed Screening Labs: 02/16/2022   reports that she has never smoked. She has never used smokeless tobacco. She reports that she does not currently use alcohol. She reports that she does not currently use drugs.  No past medical history on file.  Past Surgical History:  Procedure Laterality Date   PHARYNGEAL FLAP  1989    No current outpatient medications on file.   No current facility-administered medications for this visit.    Family History  Problem Relation Age of Onset   Stroke Mother    Hypertension Mother    Diabetes Mother    Congestive Heart Failure Father    Heart disease Father    Hypertension Father    COPD Paternal Grandmother     ROS: Constitutional: negative Genitourinary:negative  Exam:   BP (!) 151/86 (BP Location: Right Arm, Patient Position: Sitting, Cuff Size: Large)   Pulse 70   Ht 5' 4.5" (1.638 m) Comment: Reported  Wt 155 lb 6.4 oz (70.5 kg)   LMP 09/13/2022 (Approximate)   BMI 26.26 kg/m   Height: 5' 4.5" (163.8 cm) (Reported)  General appearance: alert, cooperative and appears stated age Head: Normocephalic, without obvious abnormality, atraumatic Neck: no adenopathy, supple, symmetrical, trachea midline and thyroid normal to inspection and palpation Lungs: clear to auscultation bilaterally Breasts: normal appearance, no masses or  tenderness Heart: regular rate and rhythm Abdomen: soft, non-tender; bowel sounds normal; no masses,  no organomegaly Extremities: extremities normal, atraumatic, no cyanosis or edema Skin: Skin color, texture, turgor normal. No rashes or lesions Lymph nodes: Cervical, supraclavicular, and axillary nodes normal. No abnormal inguinal nodes palpated Neurologic: Grossly normal   Pelvic: External genitalia:  no lesions              Urethra:  normal appearing urethra with no masses, tenderness or lesions              Bartholins and Skenes: normal                 Vagina: normal appearing vagina with normal color and no discharge, no lesions              Cervix: no lesions              Pap taken: No. Bimanual Exam:  Uterus:  normal size, contour, position, consistency, mobility, non-tender              Adnexa: normal adnexa and no mass, fullness, tenderness               Rectovaginal: Confirms               Anus:  normal sphincter tone, no lesions  Chaperone, Octaviano Batty, CMA, was present for exam.  Assessment/Plan: 1. Well woman exam with routine gynecological  exam - Pap smear neg with neg HR HPV 2022.  Not indicated today. - Mammogram 09/2022 - Colonoscopy guidelines reviewed - lab work done with PCP, Dr. Burnard Bunting - vaccines reviewed/updated  2. Elevated blood pressure reading in office without diagnosis of hypertension - she has seen Dr. Burnard Bunting for this and will continue to monitor at home.

## 2022-12-30 IMAGING — MG MM DIGITAL SCREENING BILAT W/ TOMO AND CAD
8 series · 8 of 24 positions shown · non-contrast
Comparison: None.

CLINICAL DATA: Screening.

EXAM:
DIGITAL SCREENING BILATERAL MAMMOGRAM WITH TOMOSYNTHESIS AND CAD
TECHNIQUE: Bilateral screening digital craniocaudal and mediolateral oblique
mammograms were obtained. Bilateral screening digital breast
tomosynthesis was performed. The images were evaluated with
computer-aided detection.

[L MLO synth-2D]
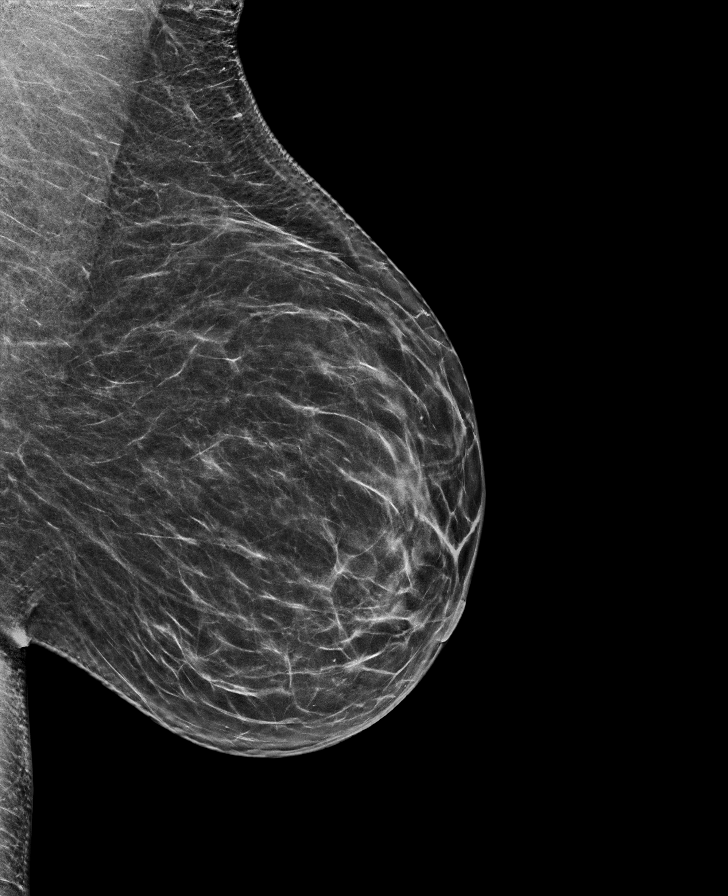

[R CC synth-2D]
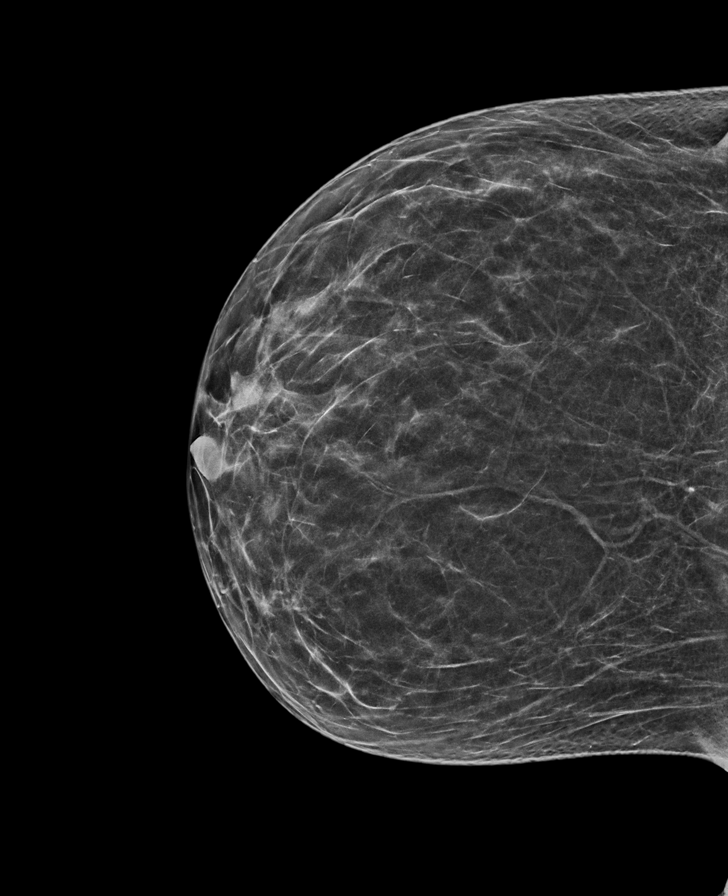

[R MLO synth-2D]
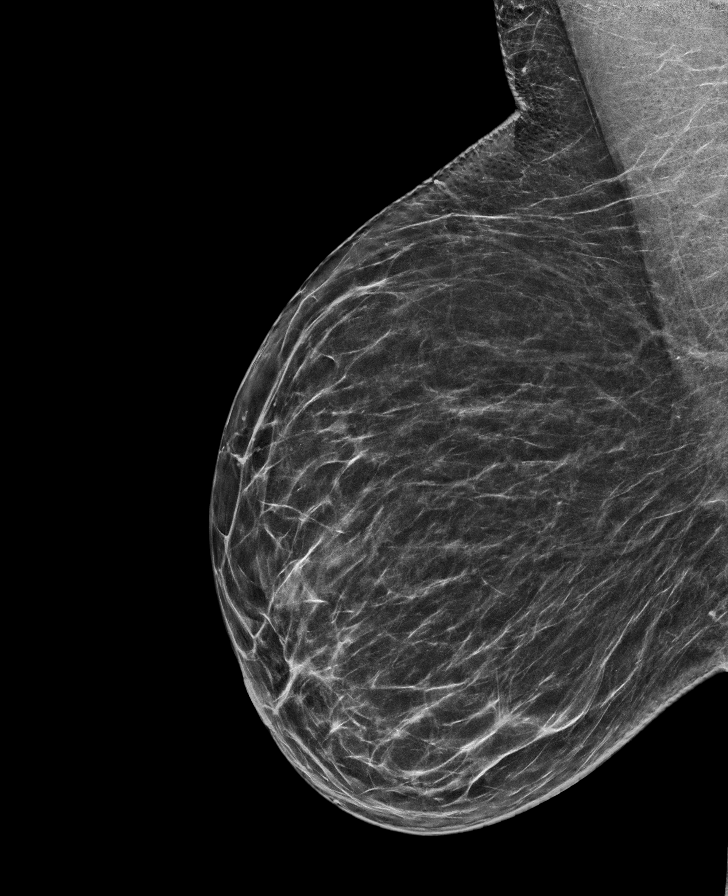

[L CC synth-2D]
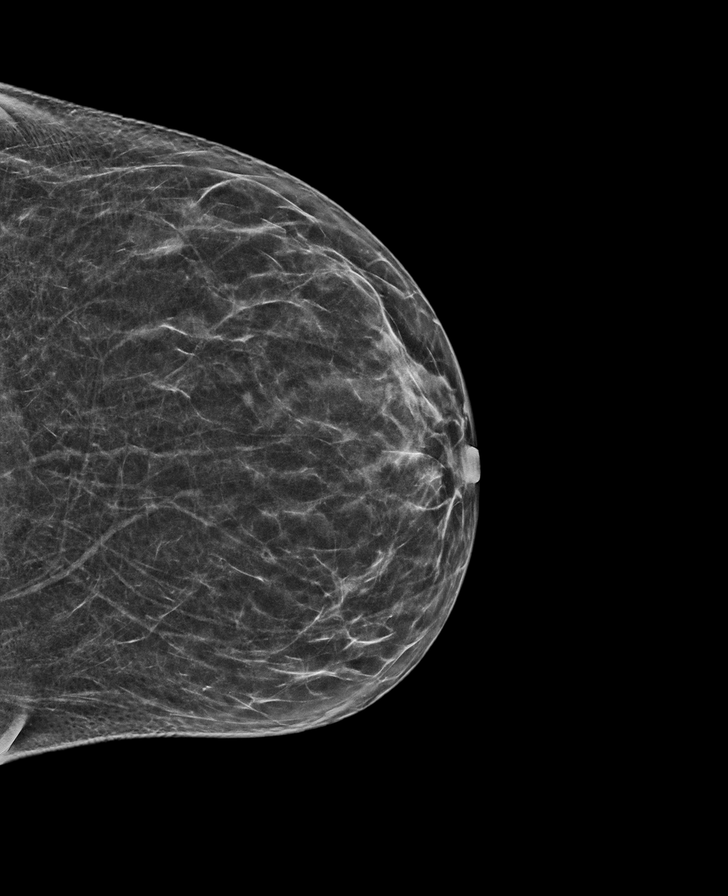

[R MLO tomo · tomo slice 36/71.0]
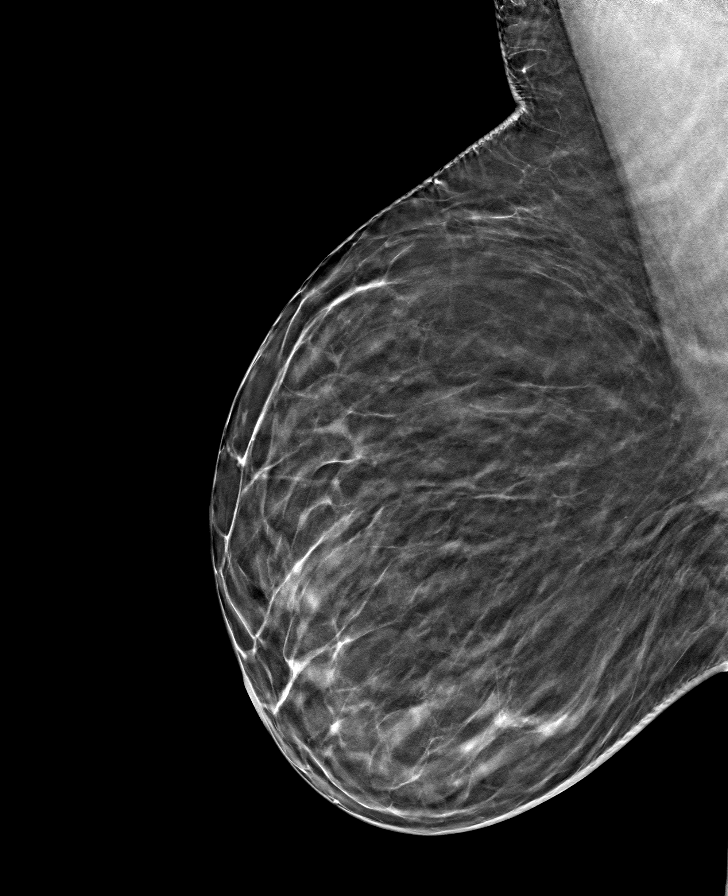

[R CC tomo · tomo slice 32/63.0]
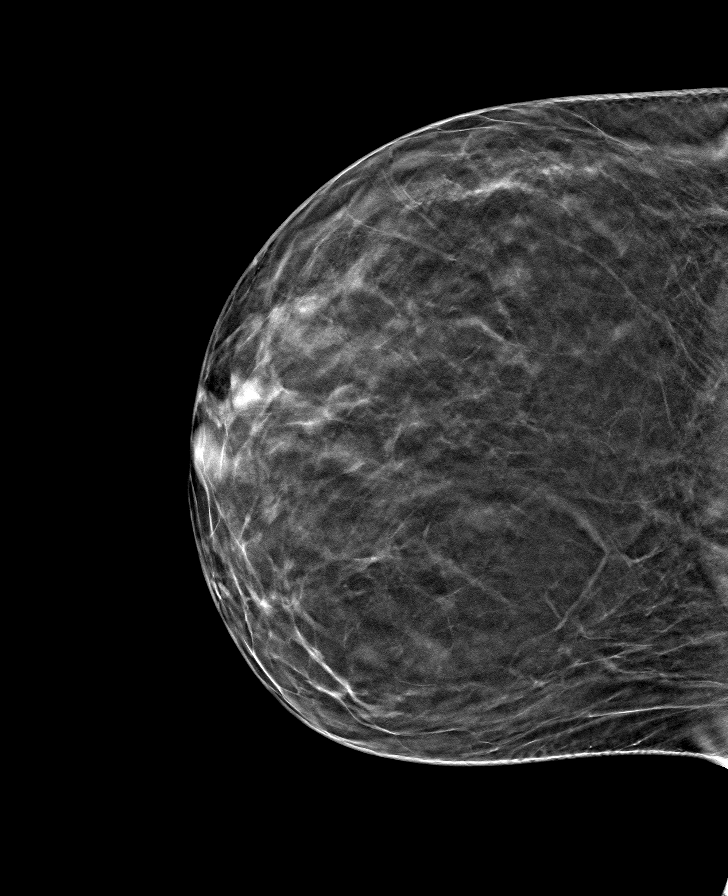

[L MLO tomo · tomo slice 36/71.0]
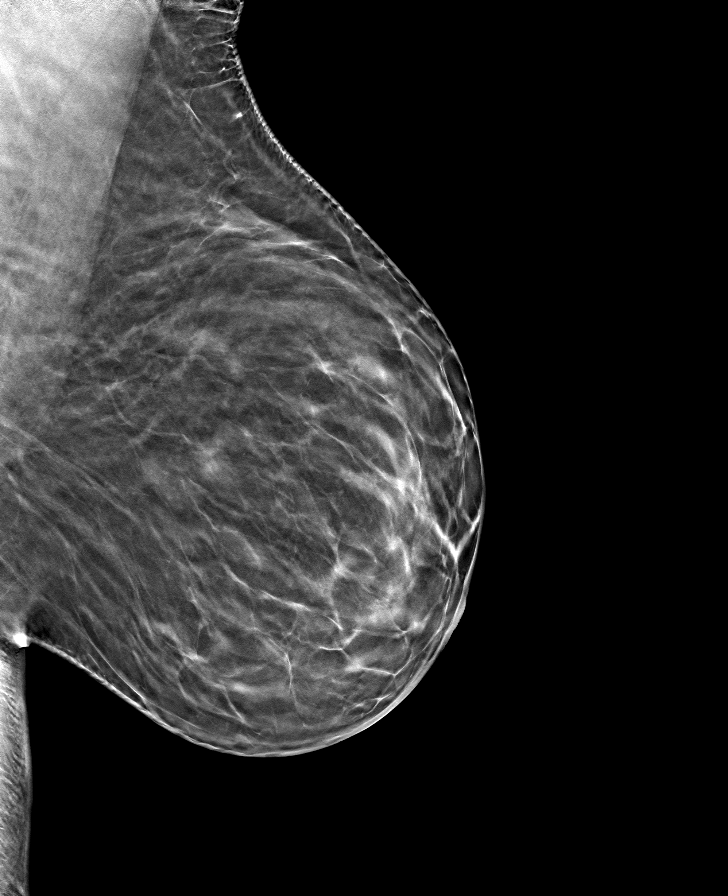

[L CC tomo · tomo slice 32/63.0]
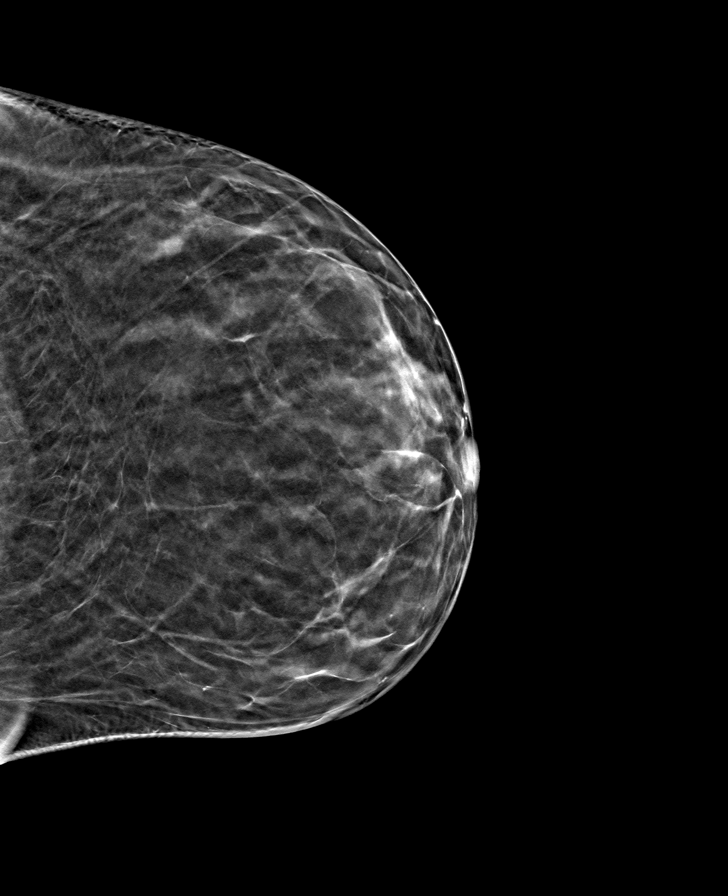

[8 of 24 positions shown; findings below may reference images not displayed]

ACR Breast Density Category b: There are scattered areas of
fibroglandular density.
FINDINGS: There are no findings suspicious for malignancy.
IMPRESSION: No mammographic evidence of malignancy. A result letter of this
screening mammogram will be mailed directly to the patient.

RECOMMENDATION:
Screening mammogram in one year. (Code:[id number]-[id number])

BI-RADS CATEGORY  1: Negative.

## 2023-03-30 ENCOUNTER — Encounter (HOSPITAL_BASED_OUTPATIENT_CLINIC_OR_DEPARTMENT_OTHER): Payer: BC Managed Care – PPO | Admitting: Family Medicine

## 2023-04-02 ENCOUNTER — Encounter (HOSPITAL_BASED_OUTPATIENT_CLINIC_OR_DEPARTMENT_OTHER): Payer: BC Managed Care – PPO | Admitting: Family Medicine

## 2023-10-01 ENCOUNTER — Encounter (HOSPITAL_BASED_OUTPATIENT_CLINIC_OR_DEPARTMENT_OTHER): Payer: Self-pay | Admitting: Family Medicine

## 2023-10-01 ENCOUNTER — Ambulatory Visit (HOSPITAL_BASED_OUTPATIENT_CLINIC_OR_DEPARTMENT_OTHER): Payer: 59 | Admitting: Family Medicine

## 2023-10-01 VITALS — BP 131/78 | HR 78 | Ht 64.5 in | Wt 156.0 lb

## 2023-10-01 DIAGNOSIS — Z23 Encounter for immunization: Secondary | ICD-10-CM | POA: Diagnosis not present

## 2023-10-01 DIAGNOSIS — Z0000 Encounter for general adult medical examination without abnormal findings: Secondary | ICD-10-CM | POA: Diagnosis not present

## 2023-10-01 NOTE — Assessment & Plan Note (Addendum)
 Routine HCM labs ordered. HCM reviewed/discussed. Anticipatory guidance regarding healthy weight, lifestyle and choices given. Recommend healthy diet.  Recommend approximately 150 minutes/week of moderate intensity exercise Recommend regular dental and vision exams Always use seatbelt/lap and shoulder restraints Recommend using smoke alarms and checking batteries at least twice a year Recommend using sunscreen when outside Discussed tetanus immunization recommendations, patient is UTD

## 2023-10-01 NOTE — Patient Instructions (Signed)
   Medication Instructions:  Your physician recommends that you continue on your current medications as directed. Please refer to the Current Medication list given to you today. --If you need a refill on any your medications before your next appointment, please call your pharmacy first. If no refills are authorized on file call the office.-- Lab Work: Your physician has recommended that you have lab work today: today If you have labs (blood work) drawn today and your tests are completely normal, you will receive your results via MyChart message OR a phone call from our staff.  Please ensure you check your voicemail in the event that you authorized detailed messages to be left on a delegated number. If you have any lab test that is abnormal or we need to change your treatment, we will call you to review the results.    Follow-Up: Your next appointment:   Your physician recommends that you schedule a follow-up appointment in: 1 year physical with Dr. de [place]  You will receive a text message or e-mail with a link to a survey about your care and experience with [place] today! We would greatly appreciate your feedback!   Thanks for letting us be apart of your health journey!!  Primary Care and Sports Medicine   Dr. Ceasar Mons [place]   We encourage you to activate your patient portal called "MyChart".  Sign up information is provided on this After Visit Summary.  MyChart is used to connect with patients for Virtual Visits (Telemedicine).  Patients are able to view lab/test results, encounter notes, upcoming appointments, etc.  Non-urgent messages can be sent to your provider as well. To learn more about what you can do with MyChart, please visit --  [email or web address].

## 2023-10-01 NOTE — Progress Notes (Signed)
Subjective:    CC: Annual Physical Exam  HPI: Kelly Harris is a 43 y.o. presenting for annual physical  I reviewed the past medical history, family history, social history, surgical history, and allergies today and no changes were needed.  Please see the problem list section below in epic for further details.  Past Medical History: History reviewed. No pertinent past medical history. Past Surgical History: Past Surgical History:  Procedure Laterality Date   PHARYNGEAL FLAP  1989   Social History: Social History   Socioeconomic History   Marital status: Single    Spouse name: Not on file   Number of children: Not on file   Years of education: Not on file   Highest education level: Master's degree (e.g., MA, MS, MEng, MEd, MSW, MBA)  Occupational History   Not on file  Tobacco Use   Smoking status: Never    Passive exposure: Never   Smokeless tobacco: Never  Vaping Use   Vaping status: Never Used  Substance and Sexual Activity   Alcohol use: Not Currently   Drug use: Not Currently   Sexual activity: Not Currently    Birth control/protection: None  Other Topics Concern   Not on file  Social History Narrative   Not on file   Social Drivers of Health   Financial Resource Strain: Low Risk  (09/30/2023)   Overall Financial Resource Strain (CARDIA)    Difficulty of Paying Living Expenses: Not very hard  Food Insecurity: No Food Insecurity (09/30/2023)   Hunger Vital Sign    Worried About Running Out of Food in the Last Year: Never true    Ran Out of Food in the Last Year: Never true  Transportation Needs: No Transportation Needs (09/30/2023)   PRAPARE - Administrator, Civil Service (Medical): No    Lack of Transportation (Non-Medical): No  Physical Activity: Sufficiently Active (09/30/2023)   Exercise Vital Sign    Days of Exercise per Week: 7 days    Minutes of Exercise per Session: 60 min  Stress: No Stress Concern Present (09/30/2023)   [employer] of Occupational Health - Occupational Stress Questionnaire    Feeling of Stress : Only a little  Social Connections: Unknown (09/30/2023)   Social Connection and Isolation Panel [NHANES]    Frequency of Communication with Friends and Family: Patient declined    Frequency of Social Gatherings with Friends and Family: Once a week    Attends Religious Services: More than 4 times per year    Active Member of [employer] or Organizations: Yes    Attends Engineer, structural: More than 4 times per year    Marital Status: Never married   Family History: Family History  Problem Relation Age of Onset   Stroke Mother    Hypertension Mother    Diabetes Mother    Congestive Heart Failure Father    Heart disease Father    Hypertension Father    COPD Paternal Grandmother    Allergies: Allergies  Allergen Reactions   Amoxicillin Hives   Medications: See med rec.  Review of Systems: No headache, visual changes, nausea, vomiting, diarrhea, constipation, dizziness, abdominal pain, skin rash, fevers, chills, night sweats, swollen lymph nodes, weight loss, chest pain, body aches, joint swelling, muscle aches, shortness of breath, mood changes, visual or auditory hallucinations.  Objective:    BP 131/78 (BP Location: Left Arm, Patient Position: Sitting, Cuff Size: Normal)   Pulse 78   Ht 5' 4.5" (1.638 m)  Wt 156 lb (70.8 kg)   LMP 09/22/2023 (Approximate)   SpO2 100%   BMI 26.36 kg/m   General: Well Developed, well nourished, and in no acute distress. Neuro: Alert and oriented x3, extra-ocular muscles intact, sensation grossly intact. Cranial nerves II through XII are intact, motor, sensory, and coordinative functions are all intact. HEENT: Normocephalic, atraumatic, pupils equal round reactive to light, neck supple, no masses, no lymphadenopathy, thyroid nonpalpable. Oropharynx, nasopharynx, external ear canals are unremarkable. Skin: Warm and dry, no rashes noted. Cardiac:  Regular rate and rhythm, no murmurs rubs or gallops. Respiratory: Clear to auscultation bilaterally. Not using accessory muscles, speaking in full sentences. Abdominal: Soft, nontender, nondistended, positive bowel sounds, no masses, no organomegaly. Musculoskeletal: Shoulder, elbow, wrist, hip, knee, ankle stable, and with full range of motion.  Impression and Recommendations:    Wellness examination Assessment & Plan: Routine HCM labs ordered. HCM reviewed/discussed. Anticipatory guidance regarding healthy weight, lifestyle and choices given. Recommend healthy diet.  Recommend approximately 150 minutes/week of moderate intensity exercise Recommend regular dental and vision exams Always use seatbelt/lap and shoulder restraints Recommend using smoke alarms and checking batteries at least twice a year Recommend using sunscreen when outside Discussed tetanus immunization recommendations, patient is UTD  Orders: -     CBC with Differential/Platelet -     Comprehensive metabolic panel -     Lipid panel -     TSH Rfx on Abnormal to Free T4  Encounter for immunization -     Flu Vaccine Trivalent High Dose (Fluad) -     Flu vaccine trivalent PF, 6mos and older(Flulaval,Afluria,Fluarix,Fluzone)  Return in about 1 year (around 09/30/2024) for CPE.   ___________________________________________ Shanina Kepple de [place], MD, ABFM, CAQSM Primary Care and Sports Medicine [facility]

## 2023-10-02 LAB — COMPREHENSIVE METABOLIC PANEL
ALT: 19 [IU]/L (ref 0–32)
AST: 16 [IU]/L (ref 0–40)
Albumin: 4.5 g/dL (ref 3.9–4.9)
Alkaline Phosphatase: 79 [IU]/L (ref 44–121)
BUN/Creatinine Ratio: 23 (ref 9–23)
BUN: 18 mg/dL (ref 6–24)
Bilirubin Total: 0.5 mg/dL (ref 0.0–1.2)
CO2: 21 mmol/L (ref 20–29)
Calcium: 9.6 mg/dL (ref 8.7–10.2)
Chloride: 102 mmol/L (ref 96–106)
Creatinine, Ser: 0.8 mg/dL (ref 0.57–1.00)
Globulin, Total: 2.4 g/dL (ref 1.5–4.5)
Glucose: 95 mg/dL (ref 70–99)
Potassium: 4.3 mmol/L (ref 3.5–5.2)
Sodium: 138 mmol/L (ref 134–144)
Total Protein: 6.9 g/dL (ref 6.0–8.5)
eGFR: 93 mL/min/{1.73_m2} (ref >59)

## 2023-10-02 LAB — LIPID PANEL
Chol/HDL Ratio: 2.4 {ratio} (ref 0.0–4.4)
Cholesterol, Total: 156 mg/dL (ref 100–199)
HDL: 65 mg/dL (ref >39)
LDL Chol Calc (NIH): 80 mg/dL (ref 0–99)
Triglycerides: 55 mg/dL (ref 0–149)
VLDL Cholesterol Cal: 11 mg/dL (ref 5–40)

## 2023-10-02 LAB — CBC WITH DIFFERENTIAL/PLATELET
Basophils Absolute: 0.1 10*3/uL (ref 0.0–0.2)
Basos: 1 %
EOS (ABSOLUTE): 0.1 10*3/uL (ref 0.0–0.4)
Eos: 1 %
Hematocrit: 42.6 % (ref 34.0–46.6)
Hemoglobin: 13.7 g/dL (ref 11.1–15.9)
Immature Grans (Abs): 0 10*3/uL (ref 0.0–0.1)
Immature Granulocytes: 0 %
Lymphocytes Absolute: 1.7 10*3/uL (ref 0.7–3.1)
Lymphs: 28 %
MCH: 30 pg (ref 26.6–33.0)
MCHC: 32.2 g/dL (ref 31.5–35.7)
MCV: 93 fL (ref 79–97)
Monocytes Absolute: 0.4 10*3/uL (ref 0.1–0.9)
Monocytes: 7 %
Neutrophils Absolute: 3.9 10*3/uL (ref 1.4–7.0)
Neutrophils: 63 %
Platelets: 223 10*3/uL (ref 150–450)
RBC: 4.56 x10E6/uL (ref 3.77–5.28)
RDW: 12.4 % (ref 11.7–15.4)
WBC: 6.2 10*3/uL (ref 3.4–10.8)

## 2023-10-02 LAB — TSH RFX ON ABNORMAL TO FREE T4: TSH: 1.89 u[IU]/mL (ref 0.450–4.500)

## 2023-11-03 ENCOUNTER — Encounter (HOSPITAL_BASED_OUTPATIENT_CLINIC_OR_DEPARTMENT_OTHER): Payer: Self-pay | Admitting: Family Medicine

## 2023-11-09 ENCOUNTER — Other Ambulatory Visit (HOSPITAL_BASED_OUTPATIENT_CLINIC_OR_DEPARTMENT_OTHER): Payer: Self-pay | Admitting: Family Medicine

## 2023-11-09 ENCOUNTER — Encounter (HOSPITAL_BASED_OUTPATIENT_CLINIC_OR_DEPARTMENT_OTHER): Payer: Self-pay | Admitting: Obstetrics & Gynecology

## 2023-11-09 ENCOUNTER — Ambulatory Visit (HOSPITAL_BASED_OUTPATIENT_CLINIC_OR_DEPARTMENT_OTHER): Payer: BC Managed Care – PPO | Admitting: Obstetrics & Gynecology

## 2023-11-09 ENCOUNTER — Ambulatory Visit (HOSPITAL_BASED_OUTPATIENT_CLINIC_OR_DEPARTMENT_OTHER)
Admission: RE | Admit: 2023-11-09 | Discharge: 2023-11-09 | Disposition: A | Discharge status: Home / Self Care | Source: Ambulatory Visit | Attending: Family Medicine | Admitting: Family Medicine

## 2023-11-09 ENCOUNTER — Encounter (HOSPITAL_BASED_OUTPATIENT_CLINIC_OR_DEPARTMENT_OTHER): Payer: Self-pay | Admitting: Radiology

## 2023-11-09 VITALS — BP 122/66 | HR 58 | Ht 64.5 in | Wt 149.6 lb

## 2023-11-09 DIAGNOSIS — Z01419 Encounter for gynecological examination (general) (routine) without abnormal findings: Secondary | ICD-10-CM | POA: Diagnosis not present

## 2023-11-09 DIAGNOSIS — Z1231 Encounter for screening mammogram for malignant neoplasm of breast: Secondary | ICD-10-CM | POA: Insufficient documentation

## 2023-11-09 NOTE — Progress Notes (Signed)
 ANNUAL EXAM Patient name: Kelly Harris MRN [id number]  Date of birth: 1981/04/10 Chief Complaint:   Annual Exam  History of Present Illness:   Kelly Harris is a 43 y.o. G0P0000 Caucasian female being seen today for a routine annual exam.  Had eye exam this past year.  Hadn't had an exam in years.  She has some optic nerve atrophy.  Saw specialist.  Had MRI.  Is having follow up.  No vision changes.  Cycles are still regular.  Flow lasts 4-5 days.    Patient's last menstrual period was 10/18/2023.   Upstream - 11/09/23 0804       Pregnancy Intention Screening   Does the patient want to become pregnant in the next year? No    Does the patient's partner want to become pregnant in the next year? No    Would the patient like to discuss contraceptive options today? No             Last pap 04/21/2021. Results were: NILM w/ HRHPV negative. H/O abnormal pap: no Last mammogram: 10/02/2022. Results were: normal. Family h/o breast cancer: no Last colonoscopy: guidelines reviewed. Family h/o colorectal cancer: no     11/09/2023    8:05 AM 10/01/2023    8:12 AM 10/16/2022    2:59 PM 10/02/2022    8:34 AM 03/27/2022   10:07 AM  Depression screen PHQ 2/9  Decreased Interest 0 0 0 0 0  Down, Depressed, Hopeless 0 0 0 0 0  PHQ - 2 Score 0 0 0 0 0  Altered sleeping  0  0 0  Tired, decreased energy  0  0 0  Change in appetite  0  0 0  Feeling bad or failure about yourself   0  0 0  Trouble concentrating  0  0 0  Moving slowly or fidgety/restless  0  0 0  Suicidal thoughts  0  0 0  PHQ-9 Score  0  0 0  Difficult doing work/chores  Not difficult at all  Not difficult at all Not difficult at all    Review of Systems:   Pertinent items are noted in HPI  Denies any abnormal bleeding, vaginal discharge, pelvic pain, bowel and bladder changes Pertinent History Reviewed:  Reviewed past medical,surgical, social and family history.   Reviewed problem list, medications and  allergies. Physical Assessment:   Vitals:   11/09/23 0803  BP: 122/66  Pulse: (!) 58  Weight: 149 lb 9.6 oz (67.9 kg)  Height: 5' 4.5" (1.638 m)  Body mass index is 25.28 kg/m.        Physical Examination:   General appearance - well appearing, and in no distress  Mental status - alert, oriented to person, place, and time  Psych:  She has a normal mood and affect  Skin - warm and dry, normal color, no suspicious lesions noted  Chest - effort normal, all lung fields clear to auscultation bilaterally  Heart - normal rate and regular rhythm  Neck:  midline trachea, no thyromegaly or nodules  Breasts - breasts appear normal, no suspicious masses, no skin or nipple changes or  axillary nodes  Abdomen - soft, nontender, nondistended, no masses or organomegaly  Pelvic - VULVA: normal appearing vulva with no masses, tenderness, skin tag on right thigh   VAGINA: normal appearing vagina with normal color and discharge, no lesions    CERVIX: normal appearing cervix without discharge or lesions, no CMT  Thin prep pap is not  indicated today  UTERUS: uterus is felt to be normal size, shape, consistency and nontender   ADNEXA: No adnexal masses or tenderness noted.  Rectal - normal rectal, good sphincter tone, no masses felt.   Extremities:  No swelling or varicosities noted  Chaperone present for exam, Hendricks Milo, CMA.  Assessment & Plan:  1. Well woman exam with routine gynecological exam (Primary) - Pap smear 04/2021 - Mammogram 2023 - Colonoscopy guidelines reviewed - lab work done with PCP, Dr. Ihor Dow - vaccines reviewed/updated   Follow-up: Return in about 1 year (around 11/08/2024).  Jerene Bears, MD 11/09/2023 8:35 AM

## 2023-11-20 ENCOUNTER — Encounter (HOSPITAL_BASED_OUTPATIENT_CLINIC_OR_DEPARTMENT_OTHER): Payer: Self-pay | Admitting: Family Medicine

## 2023-12-12 ENCOUNTER — Ambulatory Visit (HOSPITAL_BASED_OUTPATIENT_CLINIC_OR_DEPARTMENT_OTHER): Admitting: Family Medicine

## 2023-12-12 ENCOUNTER — Encounter (HOSPITAL_BASED_OUTPATIENT_CLINIC_OR_DEPARTMENT_OTHER): Payer: Self-pay | Admitting: Family Medicine

## 2023-12-12 VITALS — BP 127/69 | HR 95 | Temp 100.0°F | Ht 64.5 in | Wt 142.1 lb

## 2023-12-12 DIAGNOSIS — J111 Influenza due to unidentified influenza virus with other respiratory manifestations: Secondary | ICD-10-CM | POA: Diagnosis not present

## 2023-12-12 DIAGNOSIS — R051 Acute cough: Secondary | ICD-10-CM | POA: Diagnosis not present

## 2023-12-12 LAB — POCT INFLUENZA A/B
Influenza A, POC: NEGATIVE
Influenza B, POC: POSITIVE — AB

## 2023-12-12 LAB — POC COVID19 BINAXNOW: SARS Coronavirus 2 Ag: NEGATIVE

## 2023-12-12 NOTE — Progress Notes (Signed)
    Procedures performed today:    None.  Independent interpretation of notes and tests performed by another provider:   None.  Brief History, Exam, Impression, and Recommendations:    BP 127/69 (BP Location: Right Arm, Patient Position: Sitting, Cuff Size: Normal)   Pulse 95   Temp 100 F (37.8 C) (Oral)   Ht 5' 4.5" (1.638 m)   Wt 142 lb 1.6 oz (64.5 kg)   SpO2 98%   BMI 24.01 kg/m   Acute cough Assessment & Plan: Began with cough a few days ago, has had borderline fever. No body aches, mild SOB.  Patient not aware of any specific sick contacts, no recent travel.  Has been using OTC medications to help with symptoms. On exam, patient is in no acute distress, vital signs stable, temperature is elevated, however not to a degree of true fever.  Cardiovascular Sam with regular rate and rhythm, lungs clear to auscultation bilaterally.  Mild pharyngeal erythema, mild cervical lymphadenopathy. Point-of-care testing for coronavirus and influenza completed today, test positive for influenza.  Discussed diagnosis with patient.  Primary concern is related to potential respiratory complications.  No respiratory concerns today, however did discuss that this any changes do occur, particularly if she notices any progressive shortness of breath, to return to office or present to emergency department for further evaluation. We discussed considerations related to management of cough as well as sore throat.  She does not feel that cough is as impactful currently.  Can utilize OTC measures and conservative measures for this. Did discuss potential utilization of Tamiflu.  Given timing of symptom onset as well as lack of significant risk factors, do not feel strongly that starting Tamiflu would be recommended.  Patient in agreement and holding off on Tamiflu at this time. Will plan to follow-up as needed  Orders: -     POC COVID-19 BinaxNow -     POCT Influenza A/B  Influenza  Return if symptoms  worsen or fail to improve.   ___________________________________________ Kelly [place], MD, ABFM, CAQSM Primary Care and Sports [facility]

## 2023-12-12 NOTE — Patient Instructions (Signed)

## 2023-12-12 NOTE — Assessment & Plan Note (Signed)
 Began with cough a few days ago, has had borderline fever. No body aches, mild SOB.  Patient not aware of any specific sick contacts, no recent travel.  Has been using OTC medications to help with symptoms. On exam, patient is in no acute distress, vital signs stable, temperature is elevated, however not to a degree of true fever.  Cardiovascular Sam with regular rate and rhythm, lungs clear to auscultation bilaterally.  Mild pharyngeal erythema, mild cervical lymphadenopathy. Point-of-care testing for coronavirus and influenza completed today, test positive for influenza.  Discussed diagnosis with patient.  Primary concern is related to potential respiratory complications.  No respiratory concerns today, however did discuss that this any changes do occur, particularly if she notices any progressive shortness of breath, to return to office or present to emergency department for further evaluation. We discussed considerations related to management of cough as well as sore throat.  She does not feel that cough is as impactful currently.  Can utilize OTC measures and conservative measures for this. Did discuss potential utilization of Tamiflu.  Given timing of symptom onset as well as lack of significant risk factors, do not feel strongly that starting Tamiflu would be recommended.  Patient in agreement and holding off on Tamiflu at this time. Will plan to follow-up as needed

## 2024-09-29 ENCOUNTER — Encounter (HOSPITAL_BASED_OUTPATIENT_CLINIC_OR_DEPARTMENT_OTHER): Payer: 59 | Admitting: Family Medicine

## 2024-11-19 ENCOUNTER — Ambulatory Visit (HOSPITAL_BASED_OUTPATIENT_CLINIC_OR_DEPARTMENT_OTHER): Payer: Self-pay | Admitting: Obstetrics & Gynecology
